# Patient Record
Sex: Male | Born: 1935 | Race: White | Hispanic: No | Marital: Married | Smoking: Never smoker
Health system: Southern US, Community
[De-identification: ages and names within clinical notes are randomized; demographics above are authoritative.]

---

## 2021-12-03 ENCOUNTER — Observation Stay (HOSPITAL_BASED_OUTPATIENT_CLINIC_OR_DEPARTMENT_OTHER)
Admission: EM | Admit: 2021-12-03 | Discharge: 2021-12-04 | Disposition: A | Discharge status: Home / Self Care | Payer: Medicare Other | Attending: Internal Medicine | Admitting: Internal Medicine

## 2021-12-03 ENCOUNTER — Observation Stay (HOSPITAL_COMMUNITY): Payer: Medicare Other

## 2021-12-03 ENCOUNTER — Emergency Department (HOSPITAL_BASED_OUTPATIENT_CLINIC_OR_DEPARTMENT_OTHER): Payer: Medicare Other

## 2021-12-03 ENCOUNTER — Other Ambulatory Visit: Payer: Self-pay

## 2021-12-03 ENCOUNTER — Encounter (HOSPITAL_BASED_OUTPATIENT_CLINIC_OR_DEPARTMENT_OTHER): Payer: Self-pay | Admitting: *Deleted

## 2021-12-03 DIAGNOSIS — Z79899 Other long term (current) drug therapy: Secondary | ICD-10-CM | POA: Insufficient documentation

## 2021-12-03 DIAGNOSIS — I10 Essential (primary) hypertension: Secondary | ICD-10-CM | POA: Diagnosis not present

## 2021-12-03 DIAGNOSIS — G459 Transient cerebral ischemic attack, unspecified: Principal | ICD-10-CM | POA: Diagnosis present

## 2021-12-03 DIAGNOSIS — M6281 Muscle weakness (generalized): Secondary | ICD-10-CM | POA: Insufficient documentation

## 2021-12-03 DIAGNOSIS — Z7982 Long term (current) use of aspirin: Secondary | ICD-10-CM | POA: Diagnosis not present

## 2021-12-03 DIAGNOSIS — Z20822 Contact with and (suspected) exposure to covid-19: Secondary | ICD-10-CM | POA: Diagnosis not present

## 2021-12-03 DIAGNOSIS — H5460 Unqualified visual loss, one eye, unspecified: Secondary | ICD-10-CM

## 2021-12-03 DIAGNOSIS — H547 Unspecified visual loss: Secondary | ICD-10-CM | POA: Diagnosis present

## 2021-12-03 LAB — CBC WITH DIFFERENTIAL/PLATELET
Abs Immature Granulocytes: 0.01 10*3/uL (ref 0.00–0.07)
Basophils Absolute: 0.1 10*3/uL (ref 0.0–0.1)
Basophils Relative: 1 %
Eosinophils Absolute: 0.2 10*3/uL (ref 0.0–0.5)
Eosinophils Relative: 3 %
HCT: 37.7 % — ABNORMAL LOW (ref 39.0–52.0)
Hemoglobin: 12.3 g/dL — ABNORMAL LOW (ref 13.0–17.0)
Immature Granulocytes: 0 %
Lymphocytes Relative: 42 %
Lymphs Abs: 2.7 10*3/uL (ref 0.7–4.0)
MCH: 29.4 pg (ref 26.0–34.0)
MCHC: 32.6 g/dL (ref 30.0–36.0)
MCV: 90 fL (ref 80.0–100.0)
Monocytes Absolute: 0.7 10*3/uL (ref 0.1–1.0)
Monocytes Relative: 12 %
Neutro Abs: 2.7 10*3/uL (ref 1.7–7.7)
Neutrophils Relative %: 42 %
Platelets: 252 10*3/uL (ref 150–400)
RBC: 4.19 MIL/uL — ABNORMAL LOW (ref 4.22–5.81)
RDW: 13.7 % (ref 11.5–15.5)
WBC: 6.3 10*3/uL (ref 4.0–10.5)
nRBC: 0 % (ref 0.0–0.2)

## 2021-12-03 LAB — RESP PANEL BY RT-PCR (FLU A&B, COVID) ARPGX2
Influenza A by PCR: NEGATIVE
Influenza B by PCR: NEGATIVE
SARS Coronavirus 2 by RT PCR: NEGATIVE

## 2021-12-03 LAB — BASIC METABOLIC PANEL
Anion gap: 8 (ref 5–15)
BUN: 22 mg/dL (ref 8–23)
CO2: 24 mmol/L (ref 22–32)
Calcium: 8.8 mg/dL — ABNORMAL LOW (ref 8.9–10.3)
Chloride: 104 mmol/L (ref 98–111)
Creatinine, Ser: 1.3 mg/dL — ABNORMAL HIGH (ref 0.61–1.24)
GFR, Estimated: 53 mL/min — ABNORMAL LOW (ref >60)
Glucose, Bld: 94 mg/dL (ref 70–99)
Potassium: 4.1 mmol/L (ref 3.5–5.1)
Sodium: 136 mmol/L (ref 135–145)

## 2021-12-03 LAB — SEDIMENTATION RATE: Sed Rate: 12 mm/hr (ref 0–16)

## 2021-12-03 LAB — PROTIME-INR
INR: 1 (ref 0.8–1.2)
Prothrombin Time: 13.3 seconds (ref 11.4–15.2)

## 2021-12-03 IMAGING — MR MR MRA NECK W/O CM
1 of 3 series · 18 of 48 positions shown · non-contrast
Comparison: None.

CLINICAL DATA: Stroke follow-up



[Series 4: sag inhance (id) · sagittal · 1.2mm · 0.47mm/px · 18 of 360 slices shown]
[im 1/360]
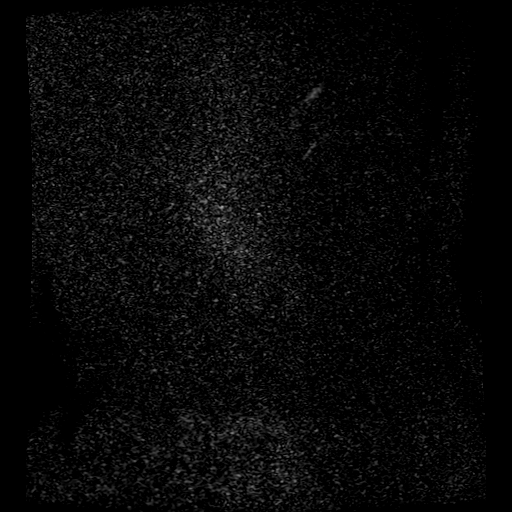
[im 12/360]
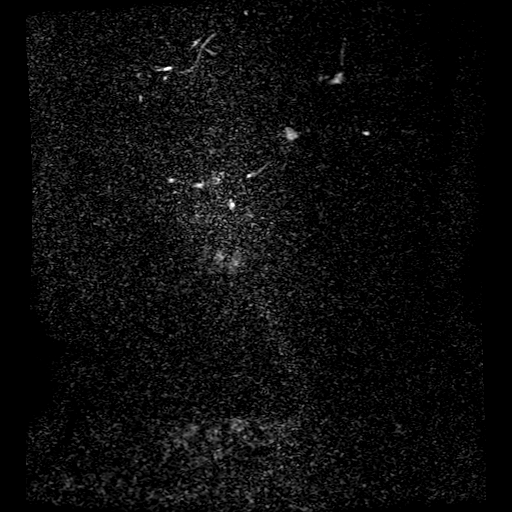
[im 23/360]
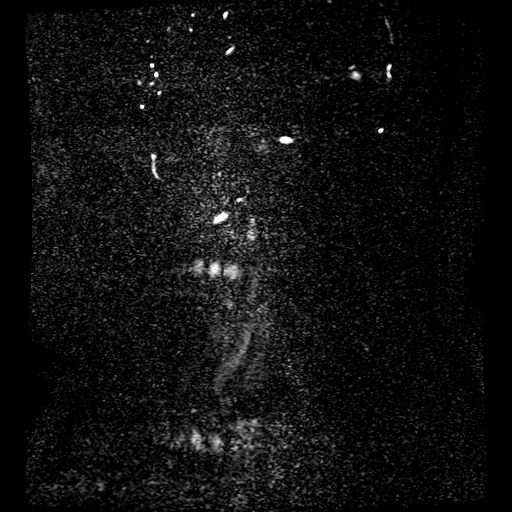
[im 34/360]
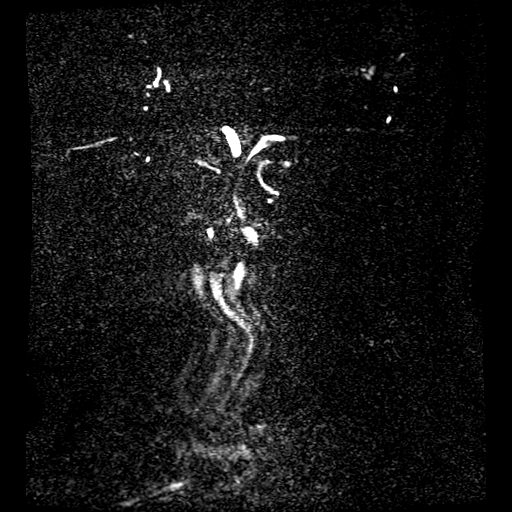
[im 45/360]
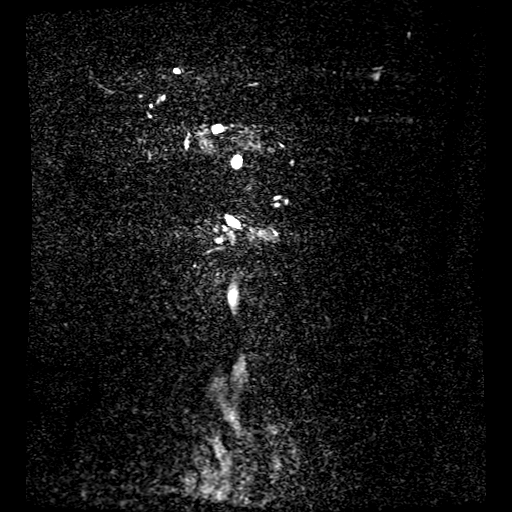
[im 57/360]
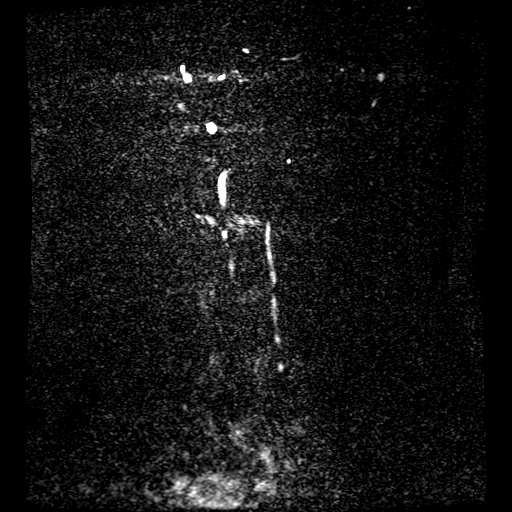
[im 68/360]
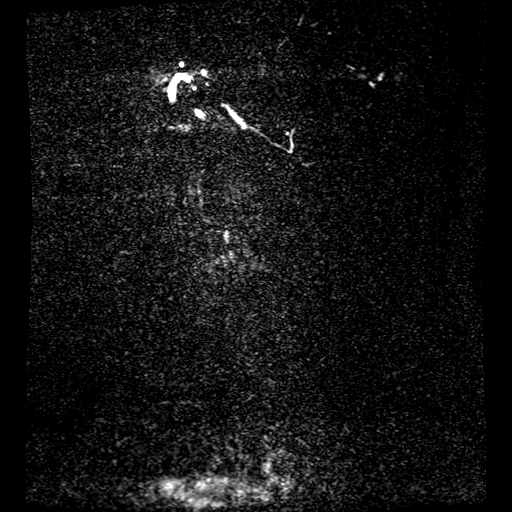
[im 79/360]
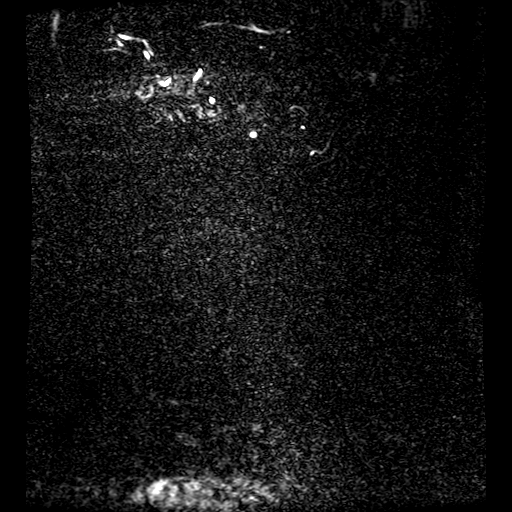
[im 90/360]
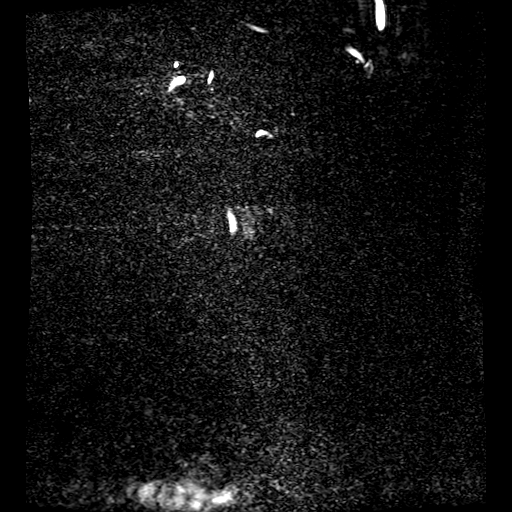
[im 101/360]
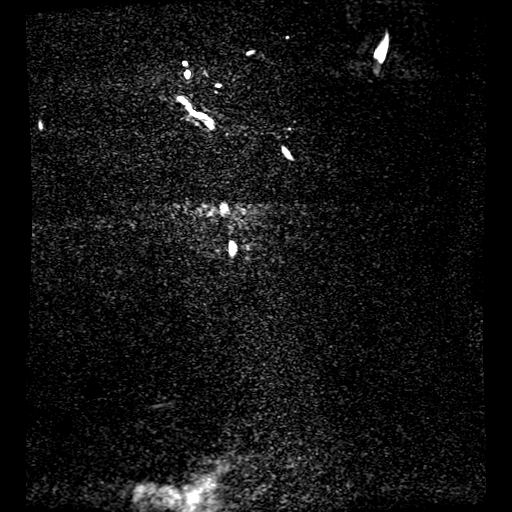
[im 113/360]
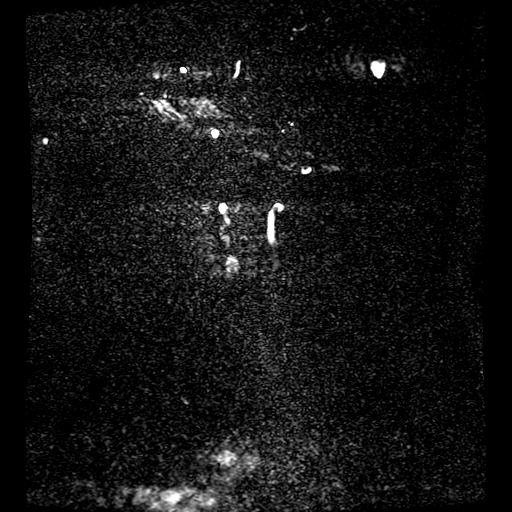
[im 158/360]
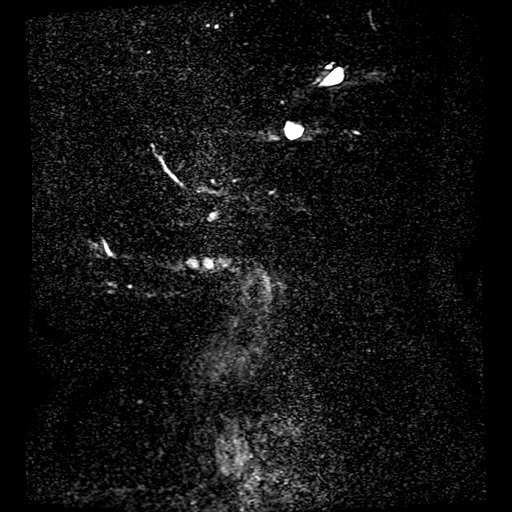
[im 180/360]
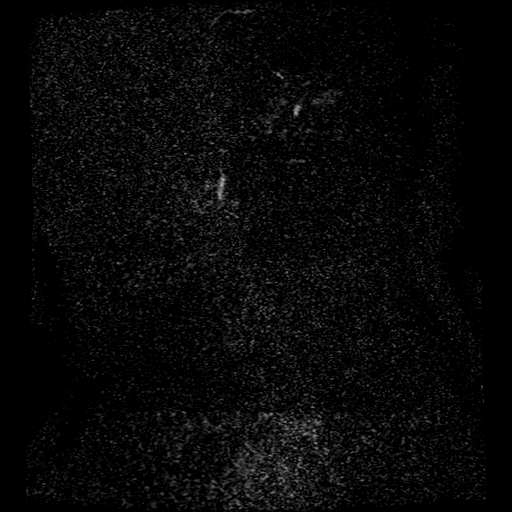
[im 202/360]
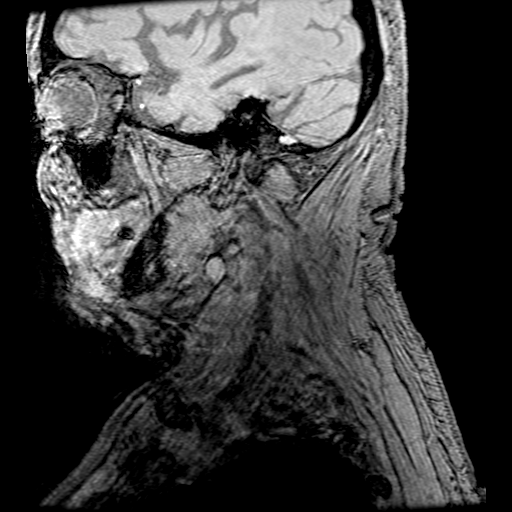
[im 247/360]
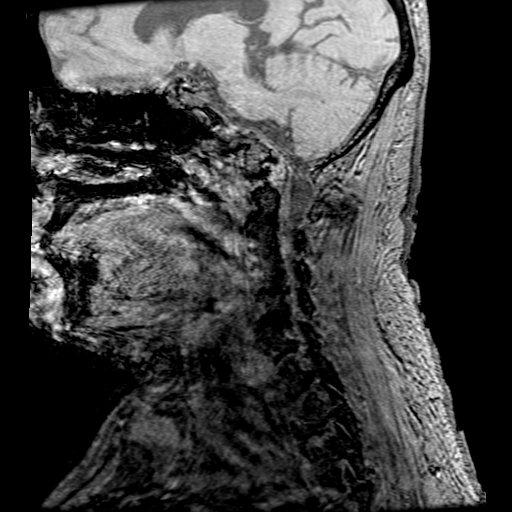
[im 292/360]
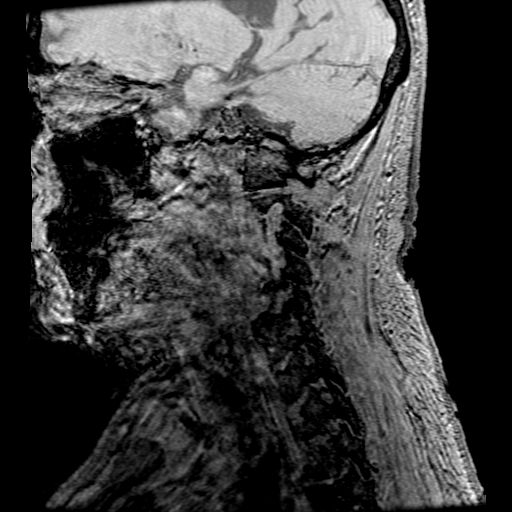
[im 303/360]
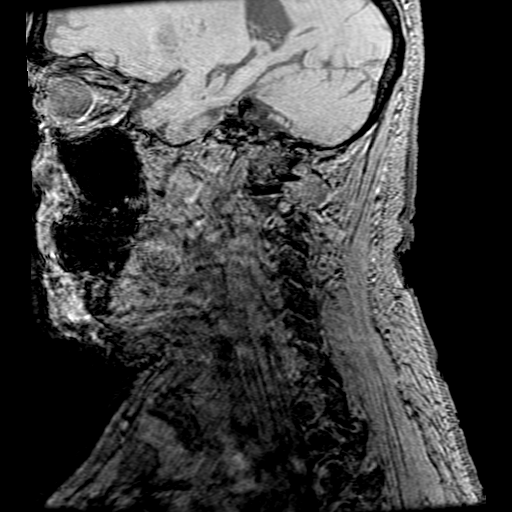
[im 337/360]
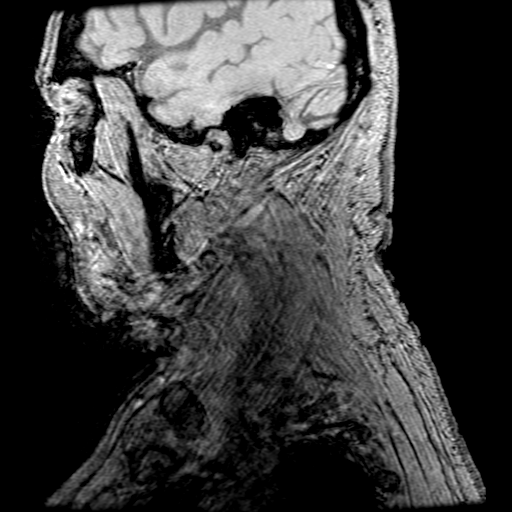

[18 of 48 positions shown; findings below may reference images not displayed]

FINDINGS: MRI HEAD FINDINGS

Brain: No acute infarct, mass effect or extra-axial collection. No
acute or chronic hemorrhage. There is multifocal hyperintense
T2-weighted signal within the white matter. Generalized volume loss
without a clear lobar predilection. If the midline structures are
normal.

Vascular: Major flow voids are preserved.

Skull and upper cervical spine: Normal calvarium and skull base.
Visualized upper cervical spine and soft tissues are normal.

Sinuses/Orbits:No paranasal sinus fluid levels or advanced mucosal
thickening. No mastoid or middle ear effusion. Normal orbits.

MRA HEAD FINDINGS

POSTERIOR CIRCULATION:

--Vertebral arteries: Normal

--Inferior cerebellar arteries: Normal.

--Basilar artery: Normal.

--Superior cerebellar arteries: Normal.

--Posterior cerebral arteries: Normal.

ANTERIOR CIRCULATION:

--Intracranial internal carotid arteries: There is a medially
projecting aneurysm measuring 2 x 2 mm arising from the distal
cavernous segment of the right internal artery. Additionally, there
is a superiorly and laterally projecting aneurysm in the same
location measuring 3 x 2 mm. The left internal carotid artery is
normal.

--Anterior cerebral arteries (ACA): Normal.

--Middle cerebral arteries (MCA): Normal.

ANATOMIC VARIANTS: Fetal origin of the left PCA.

MRA NECK FINDINGS

Three vessel branch pattern of the aorta. Right dominant vertebral
arteries. The left vertebral artery V1 segment shows no flow related
enhancement. The right vertebral artery is normal. The carotid
systems are normal.
IMPRESSION: 1. No acute intracranial abnormality.
2. Mild chronic small vessel disease and volume loss.
3. No emergent large vessel occlusion or high-grade stenosis.
4. There are 2 aneurysms of the distal cavernous segment of the
right internal carotid artery, each measuring approximately 2 x 3
mm.

## 2021-12-03 IMAGING — CT CT HEAD W/O CM
4 series · 16 of 47 positions shown, 18 images · non-contrast
Comparison: None.

CLINICAL DATA: loss of vision in Left eye (temp)

EXAM:
CT HEAD WITHOUT CONTRAST
TECHNIQUE: Contiguous axial images were obtained from the base of the skull
through the vertex without intravenous contrast.

[Series 2: head wo · axial · 0.44mm/px · z∈[-99,+11]mm · 7 of 30 slices shown, 9 images]
[im 4/30  brain]
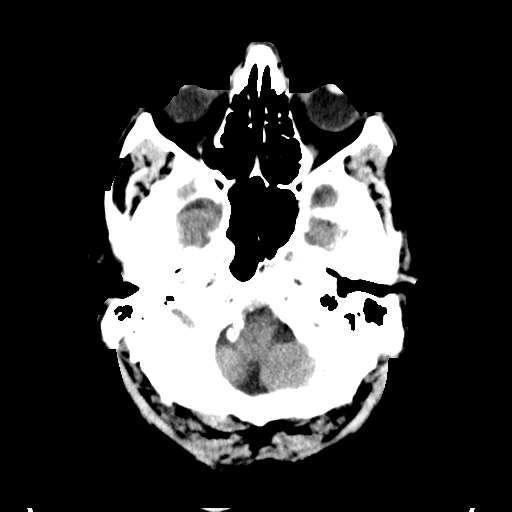
[im 4/30  bone]
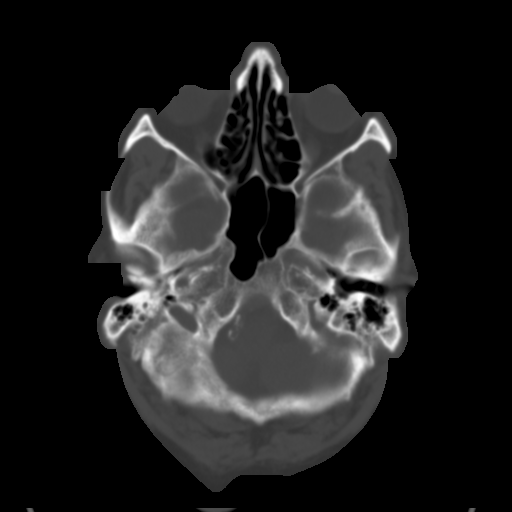
[im 8/30  brain]
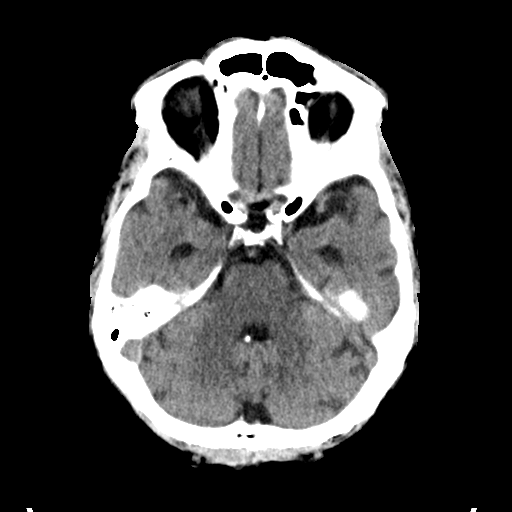
[im 11/30  brain]
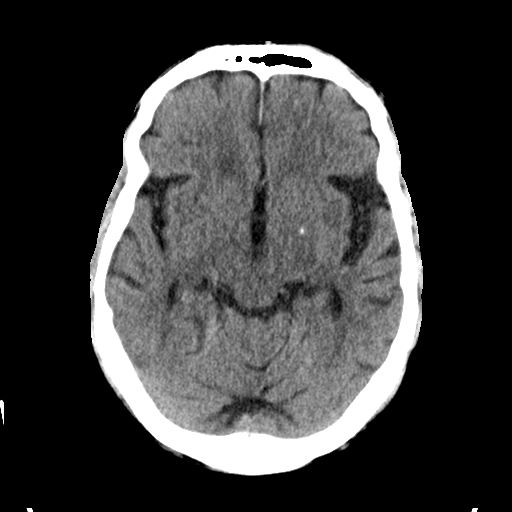
[im 15/30  brain]
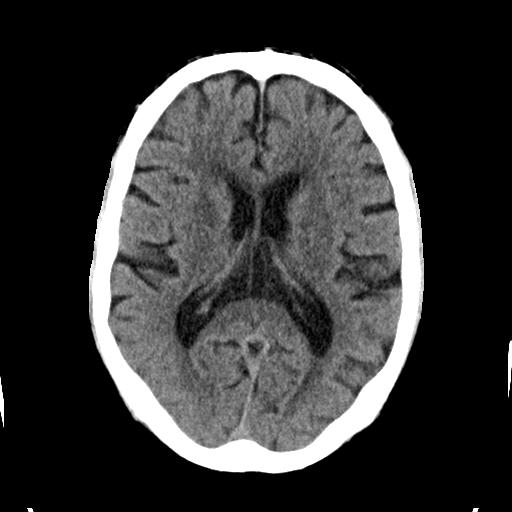
[im 19/30  brain]
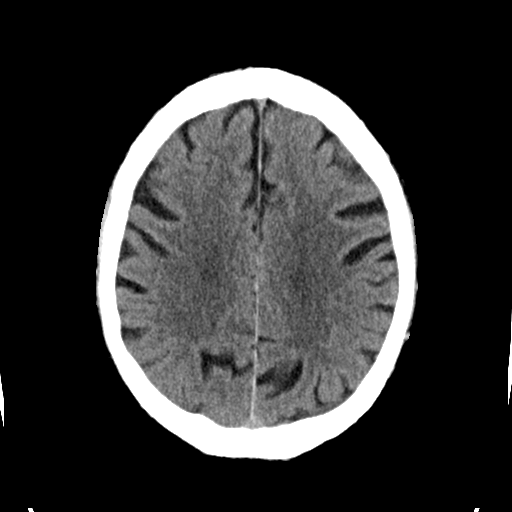
[im 19/30  bone]
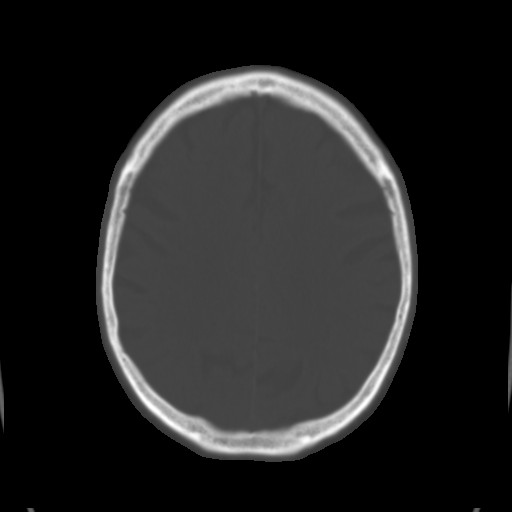
[im 22/30  brain]
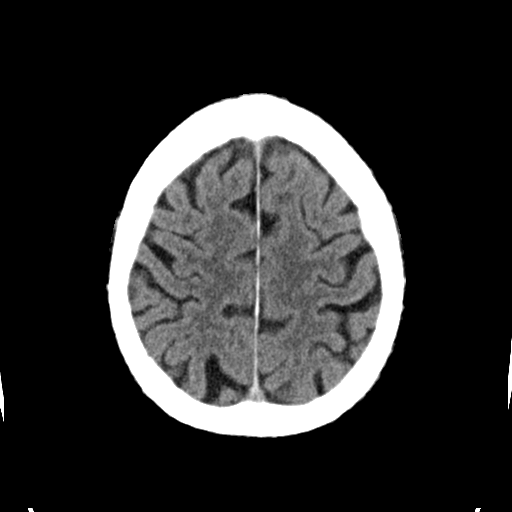
[im 26/30  brain]
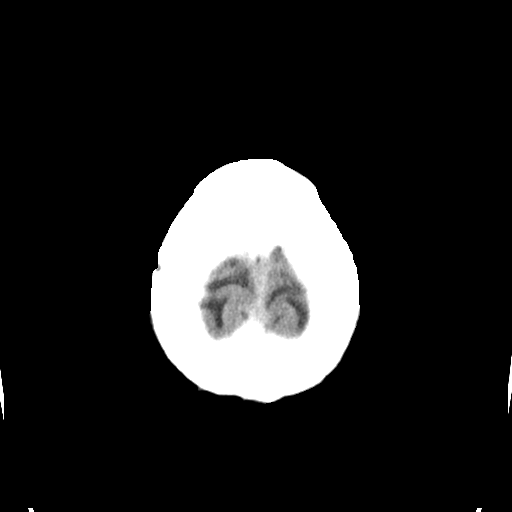

[Series 3: head bone · axial · 0.44mm/px · z∈[-100,-72]mm · 3 of 74 slices shown]
[im 8/74  bone]
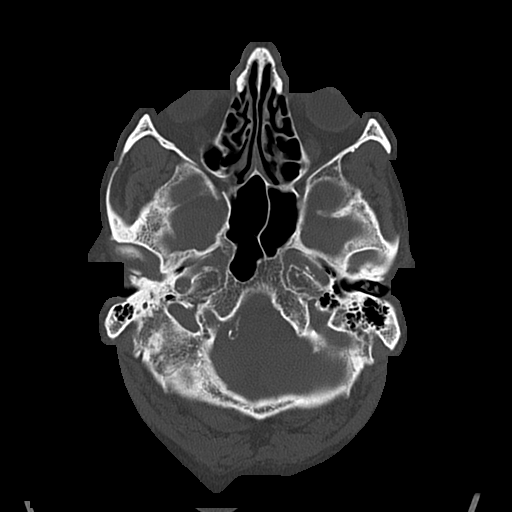
[im 15/74  bone]
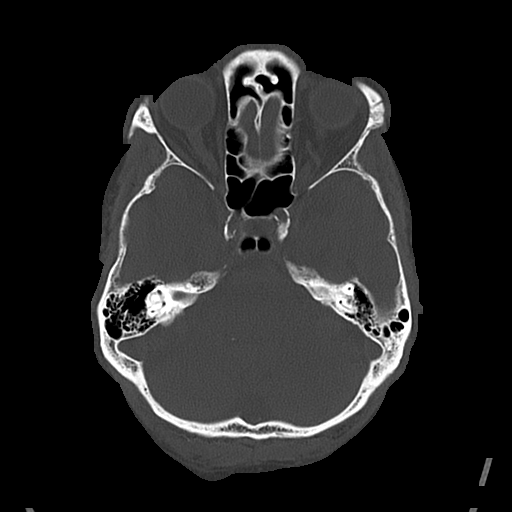
[im 22/74  bone]
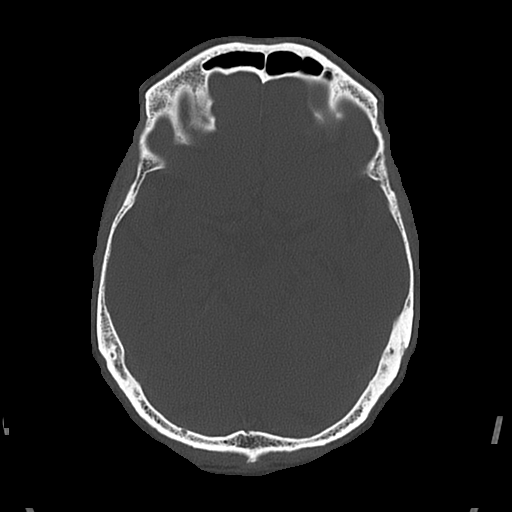

[Series 4: coronal soft · coronal · 0.29mm/px · 3 of 69 slices shown]
[im 23/69  brain]
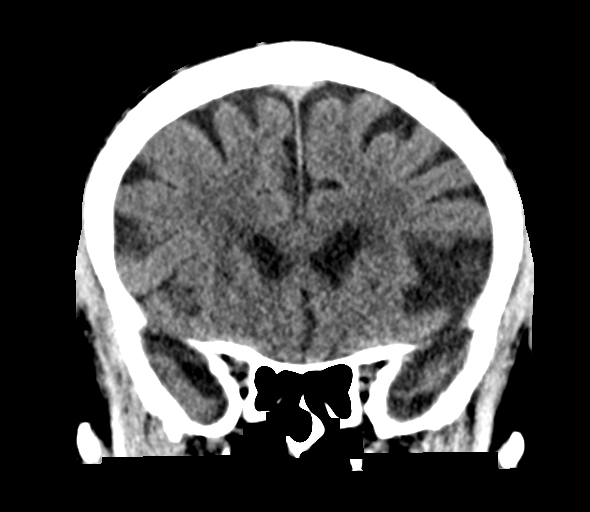
[im 31/69  brain]
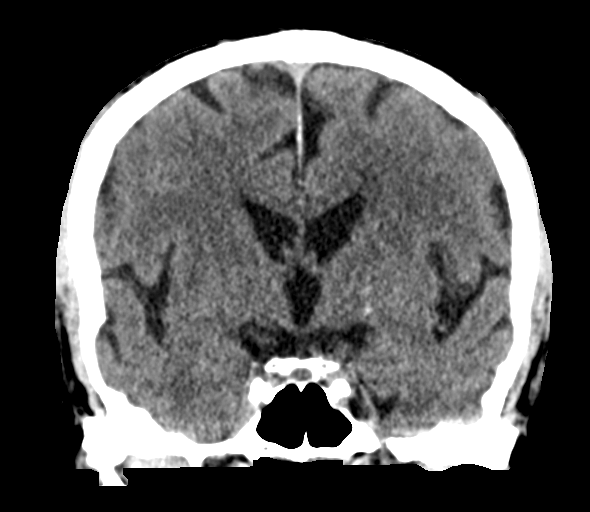
[im 38/69  brain]
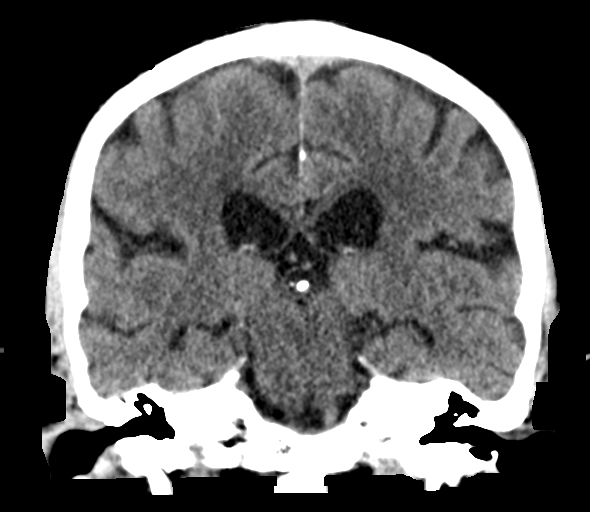

[Series 5: sagittal soft · sagittal · 0.30mm/px · 3 of 59 slices shown]
[im 20/59  brain]
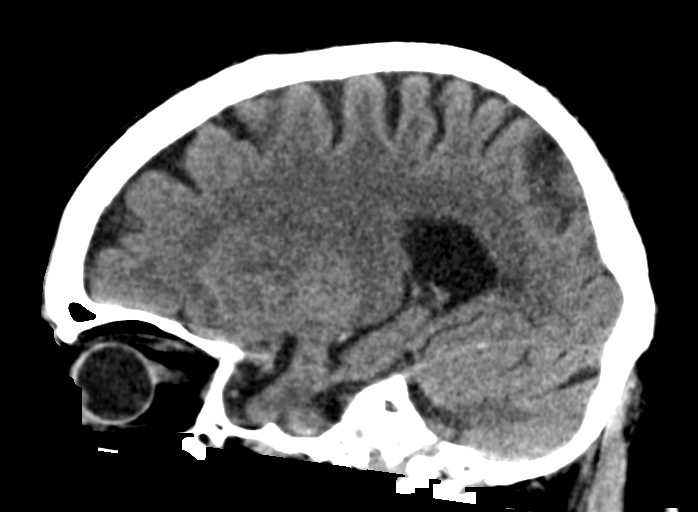
[im 30/59  brain]
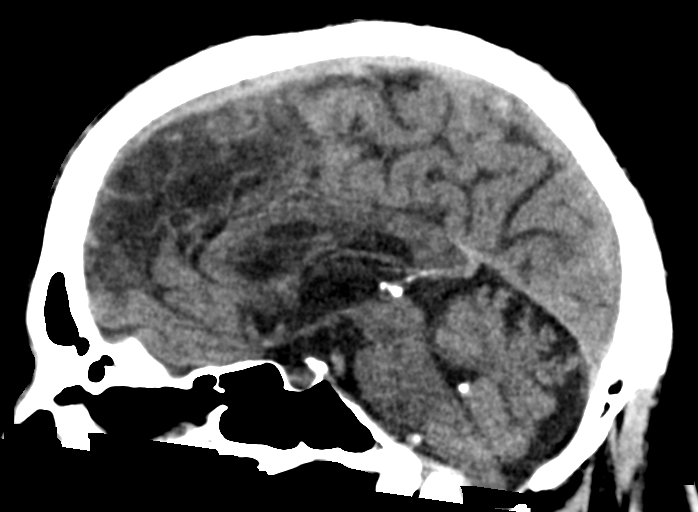
[im 39/59  brain]
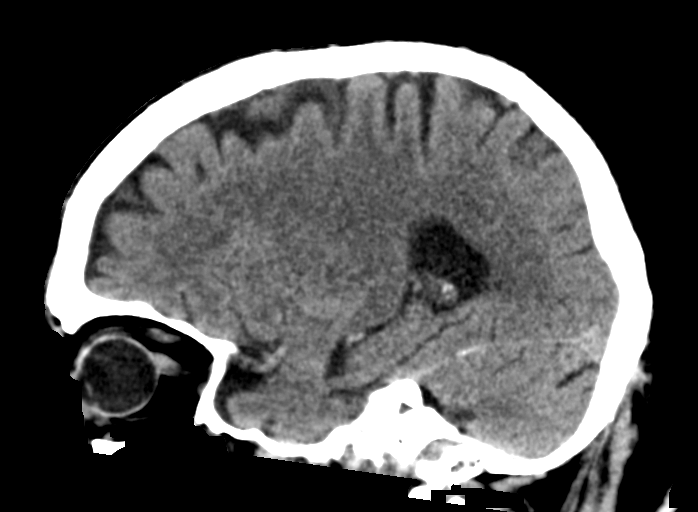

[16 of 47 positions shown; findings below may reference images not displayed]

FINDINGS: Brain: Patchy hypodensities in the basal ganglia bilaterally. No
evidence of acute hemorrhage, mass lesion, hydrocephalus,
extra-axial fluid collection or midline shift. Mild for age atrophy.

Vascular: No hyperdense vessel identified. Calcific intracranial
atherosclerosis.

Skull: No acute fracture.

Sinuses/Orbits: Visualized sinuses are clear.

Other: No mastoid effusions.
IMPRESSION: Patchy hypodensities within the basal ganglia bilaterally. This
could potentially represent chronic microvascular ischemic disease;
however, in the absence of a prior acute infarct is difficult to
exclude. Consider MRI for more sensitive evaluation.

## 2021-12-03 IMAGING — MR MR HEAD W/O CM
6 of 11 series · 24 of 48 positions shown · non-contrast
Comparison: None.

CLINICAL DATA: Stroke follow-up



[Series 2: DWI · axial · 3.0mm · 0.94mm/px · z∈[-140,+13]mm · 7 of 106 slices shown (1 of 2)]
[im 1/106]
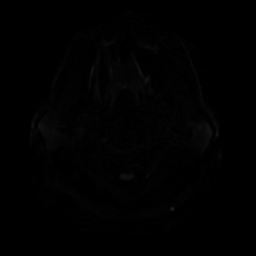
[im 18/106]
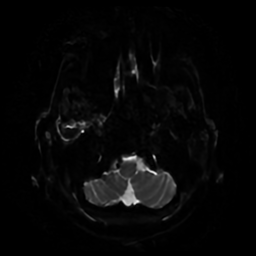
[im 36/106]
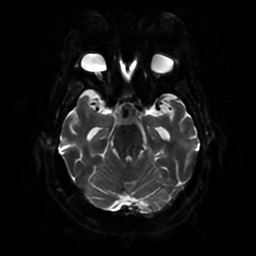
[im 53/106]
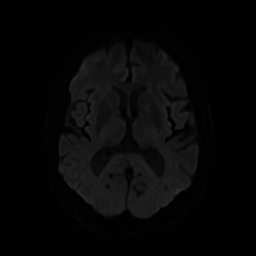
[im 71/106]
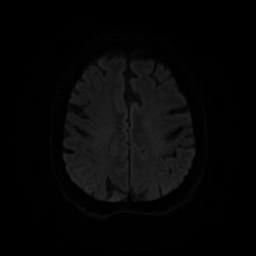
[im 88/106]
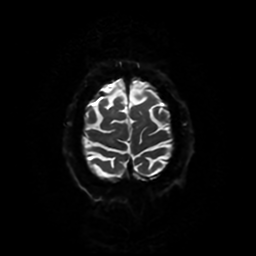
[im 106/106]
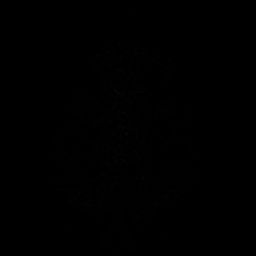

[Series 3: DWI · coronal · 4.0mm · 0.94mm/px · 5 of 76 slices shown (2 of 2)]
[im 1/76]
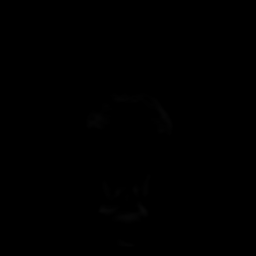
[im 19/76]
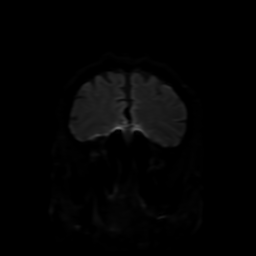
[im 38/76]
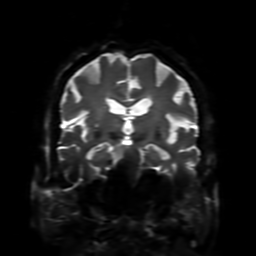
[im 57/76]
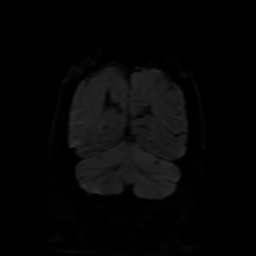
[im 76/76]
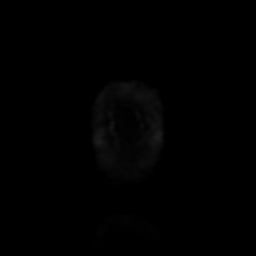

[Series 4: FLAIR · sagittal · 5.0mm · 0.23mm/px · 2 of 27 slices shown (1 of 2)]
[im 1/27]
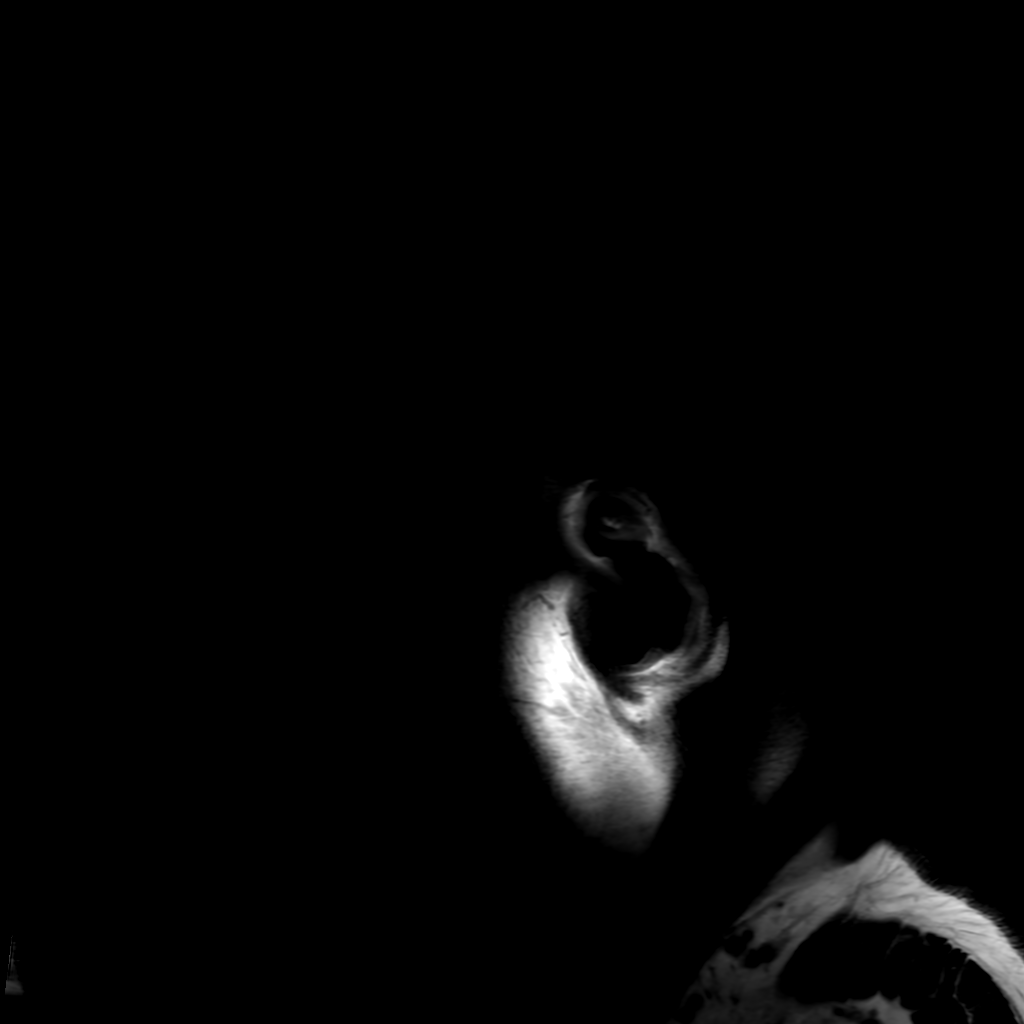
[im 27/27]
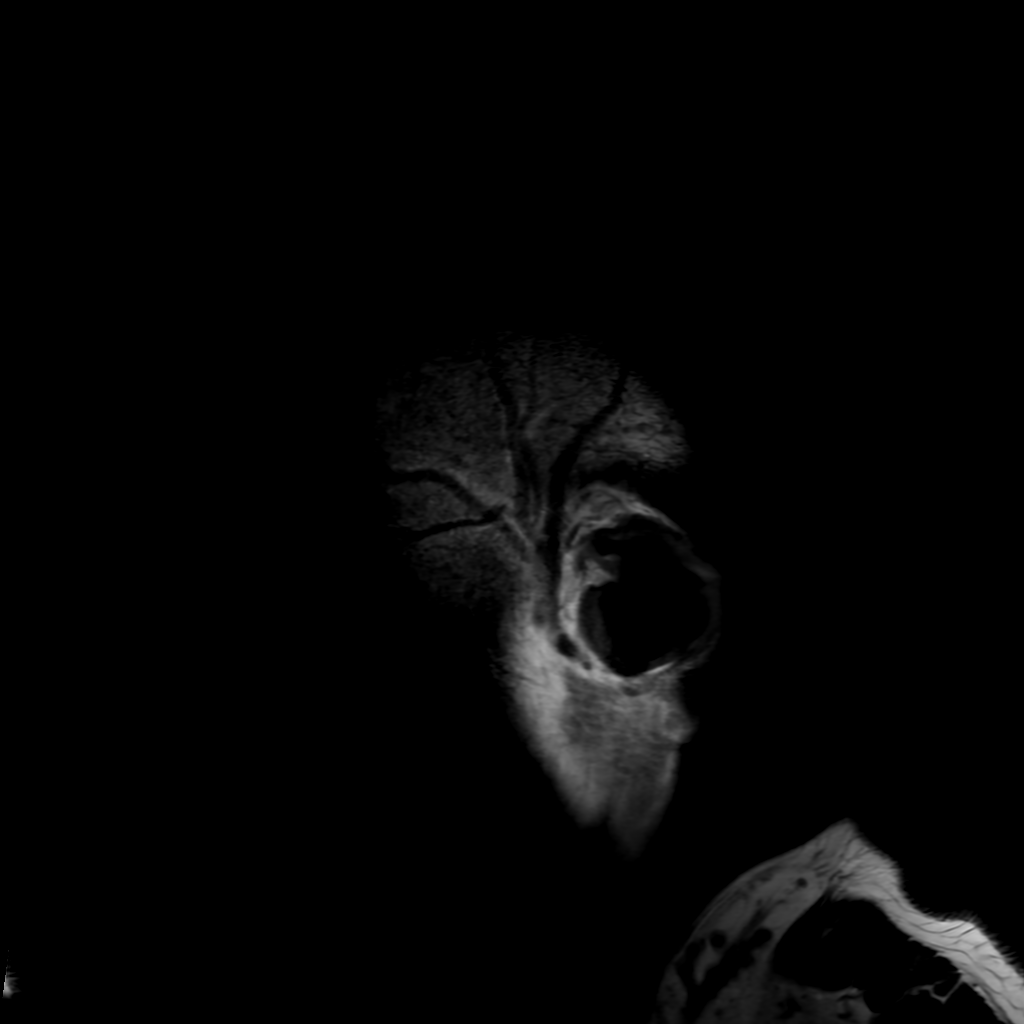

[Series 6: FLAIR · axial · 4.0mm · 0.45mm/px · z∈[-142,+3]mm · 3 of 36 slices shown (2 of 2)]
[im 1/36]
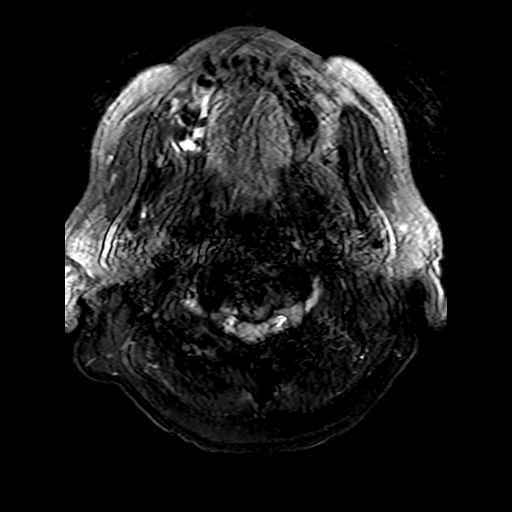
[im 18/36]
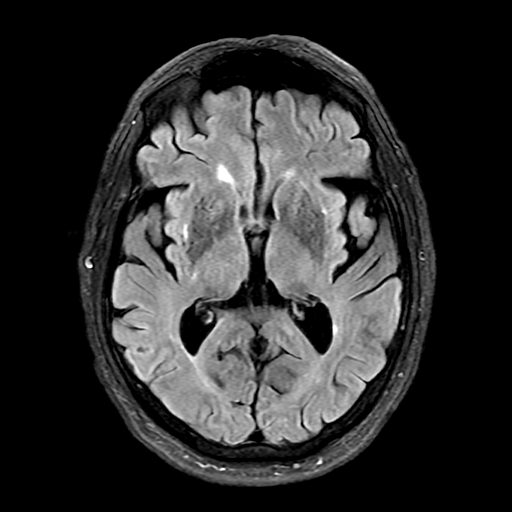
[im 36/36]
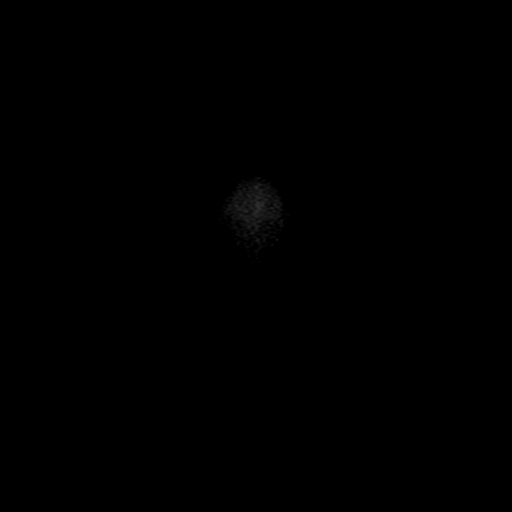

[Series 250: ADC · axial · 3.0mm · 0.94mm/px · z∈[-140,+13]mm · 4 of 54 slices shown (1 of 2)]
[im 1/54]
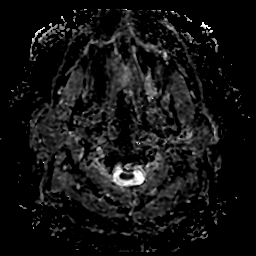
[im 18/54]
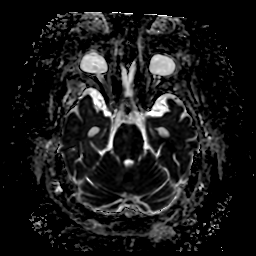
[im 36/54]
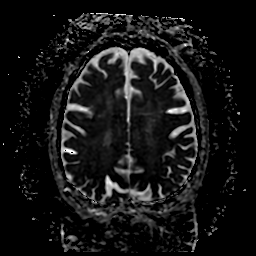
[im 54/54]
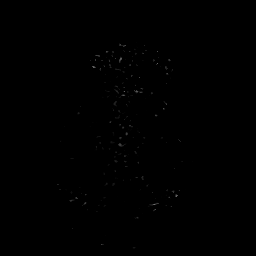

[Series 350: ADC · coronal · 4.0mm · 0.94mm/px · 3 of 37 slices shown (2 of 2)]
[im 1/37]
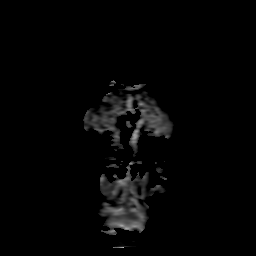
[im 19/37]
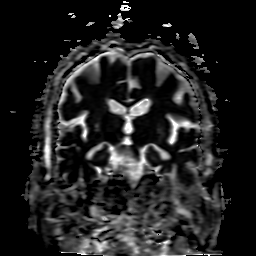
[im 37/37]
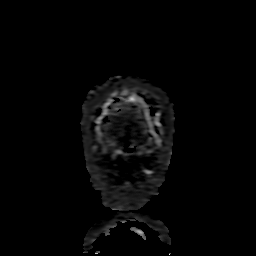

[24 of 48 positions shown; findings below may reference images not displayed]

FINDINGS: MRI HEAD FINDINGS

Brain: No acute infarct, mass effect or extra-axial collection. No
acute or chronic hemorrhage. There is multifocal hyperintense
T2-weighted signal within the white matter. Generalized volume loss
without a clear lobar predilection. If the midline structures are
normal.

Vascular: Major flow voids are preserved.

Skull and upper cervical spine: Normal calvarium and skull base.
Visualized upper cervical spine and soft tissues are normal.

Sinuses/Orbits:No paranasal sinus fluid levels or advanced mucosal
thickening. No mastoid or middle ear effusion. Normal orbits.

MRA HEAD FINDINGS

POSTERIOR CIRCULATION:

--Vertebral arteries: Normal

--Inferior cerebellar arteries: Normal.

--Basilar artery: Normal.

--Superior cerebellar arteries: Normal.

--Posterior cerebral arteries: Normal.

ANTERIOR CIRCULATION:

--Intracranial internal carotid arteries: There is a medially
projecting aneurysm measuring 2 x 2 mm arising from the distal
cavernous segment of the right internal artery. Additionally, there
is a superiorly and laterally projecting aneurysm in the same
location measuring 3 x 2 mm. The left internal carotid artery is
normal.

--Anterior cerebral arteries (ACA): Normal.

--Middle cerebral arteries (MCA): Normal.

ANATOMIC VARIANTS: Fetal origin of the left PCA.

MRA NECK FINDINGS

Three vessel branch pattern of the aorta. Right dominant vertebral
arteries. The left vertebral artery V1 segment shows no flow related
enhancement. The right vertebral artery is normal. The carotid
systems are normal.
IMPRESSION: 1. No acute intracranial abnormality.
2. Mild chronic small vessel disease and volume loss.
3. No emergent large vessel occlusion or high-grade stenosis.
4. There are 2 aneurysms of the distal cavernous segment of the
right internal carotid artery, each measuring approximately 2 x 3
mm.

## 2021-12-03 IMAGING — MR MR MRA HEAD W/O CM
2 series · 18 of 48 positions shown · non-contrast
Comparison: None.

CLINICAL DATA: Stroke follow-up



[Series 2: ax (id) · axial · 1.0mm · 0.43mm/px · z∈[-116,-35]mm · 17 of 176 slices shown]
[im 1/176]
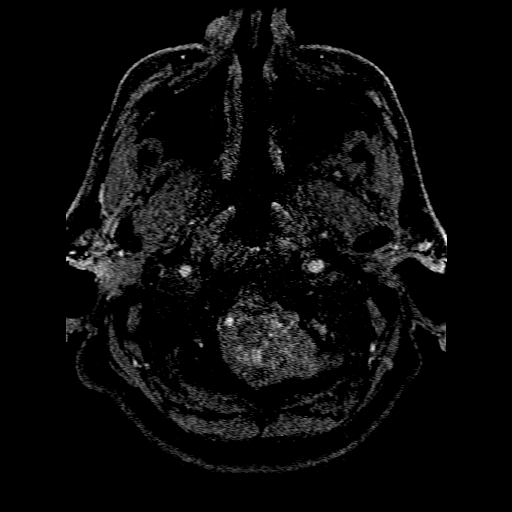
[im 4/176]
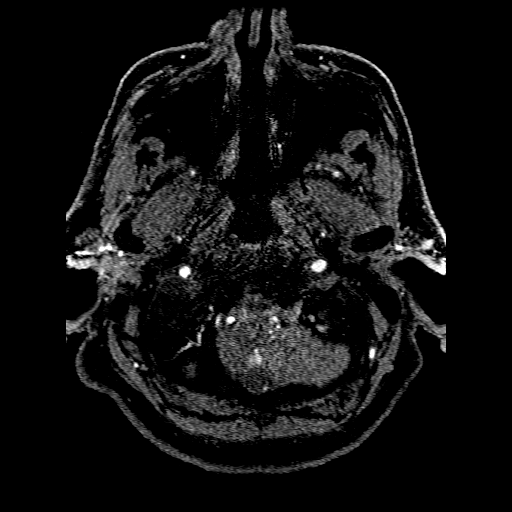
[im 8/176]
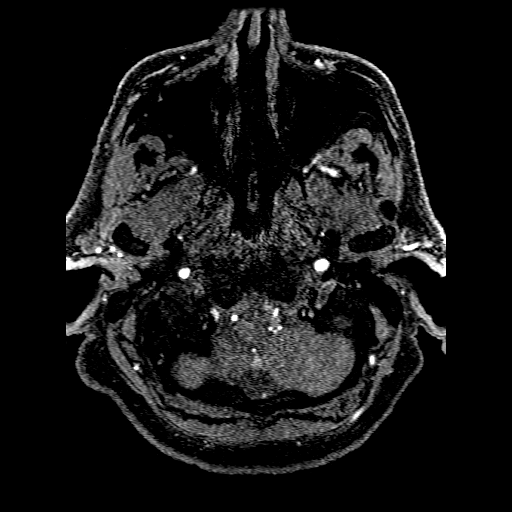
[im 12/176]
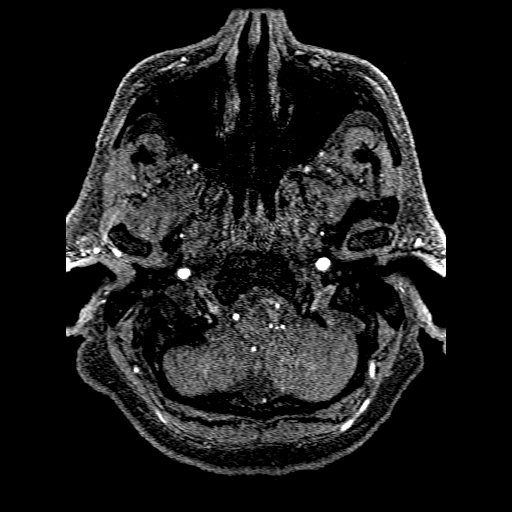
[im 16/176]
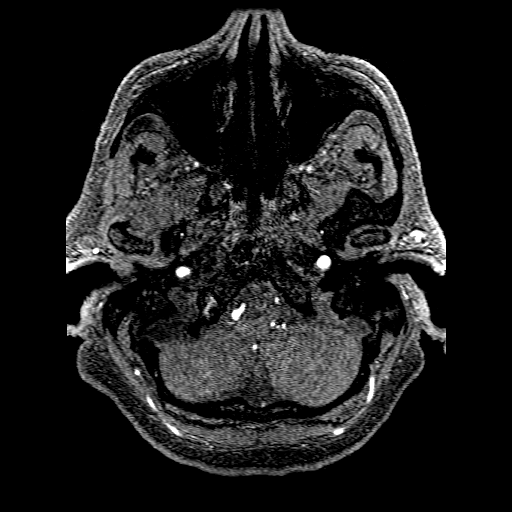
[im 20/176]
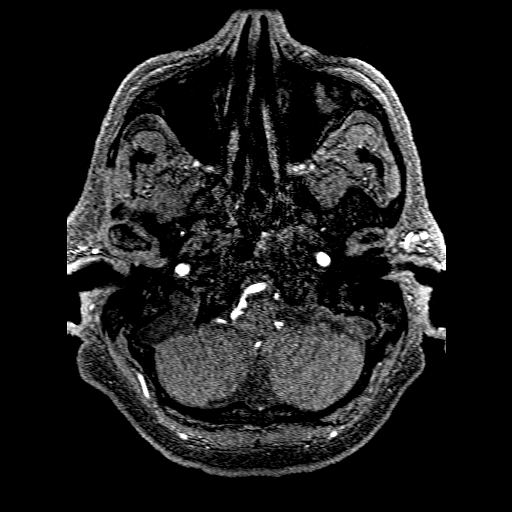
[im 23/176]
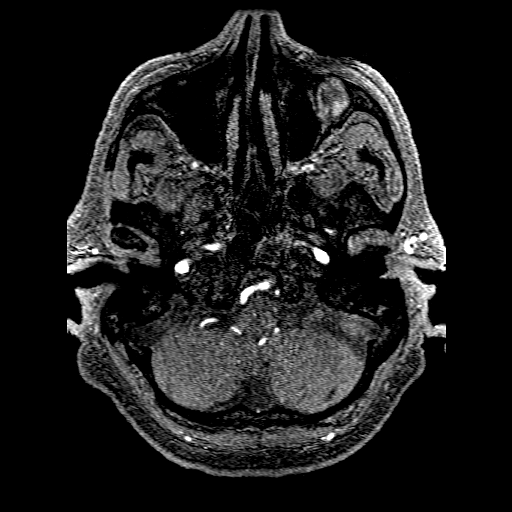
[im 27/176]
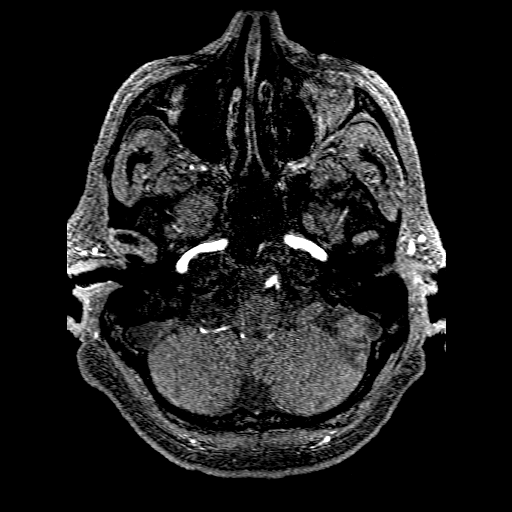
[im 31/176]
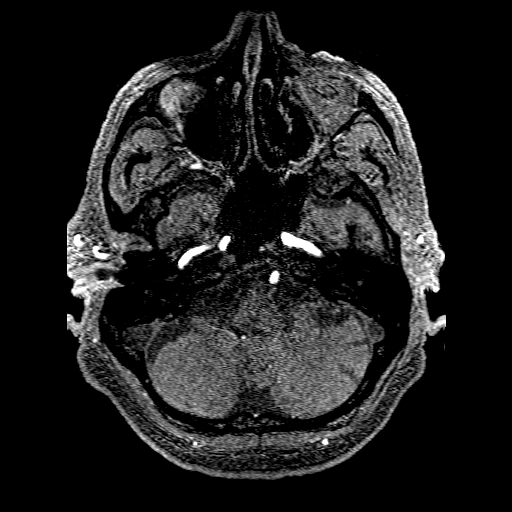
[im 54/176]
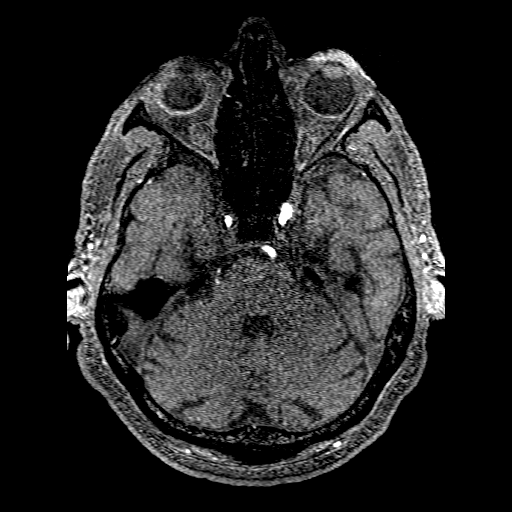
[im 77/176]
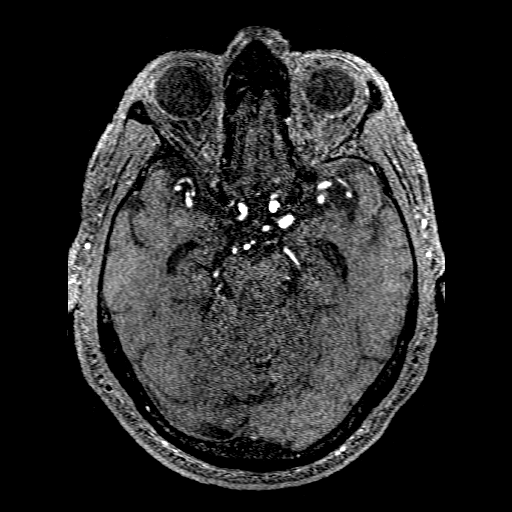
[im 88/176]
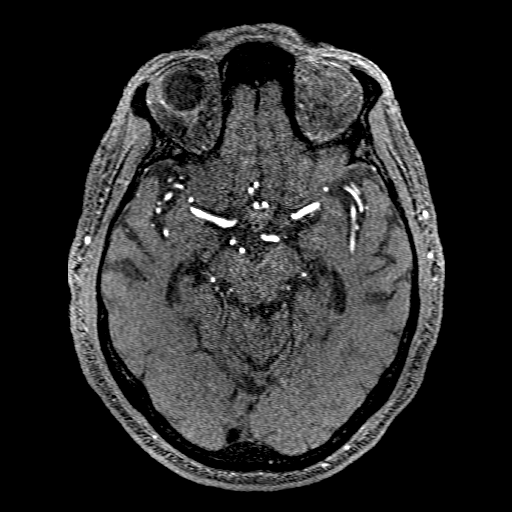
[im 99/176]
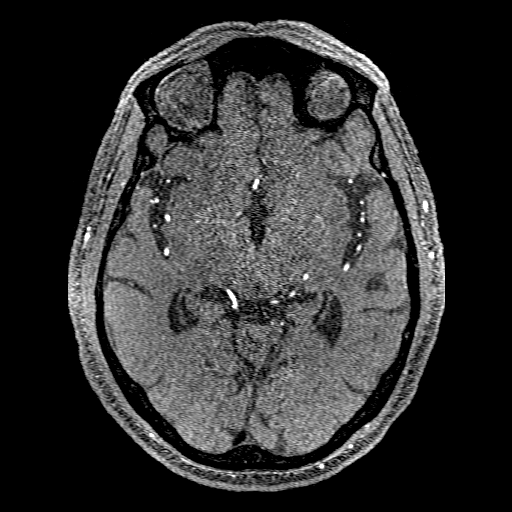
[im 122/176]
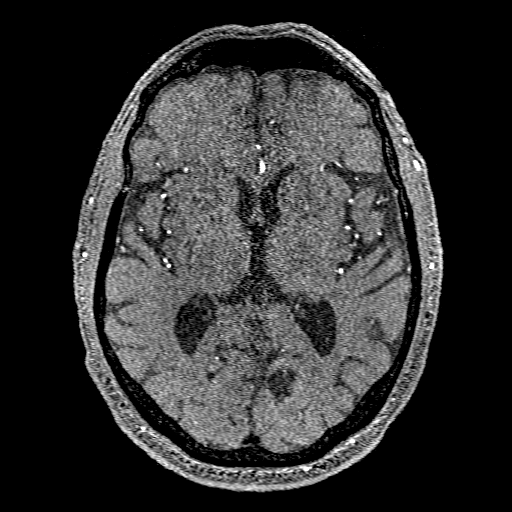
[im 145/176]
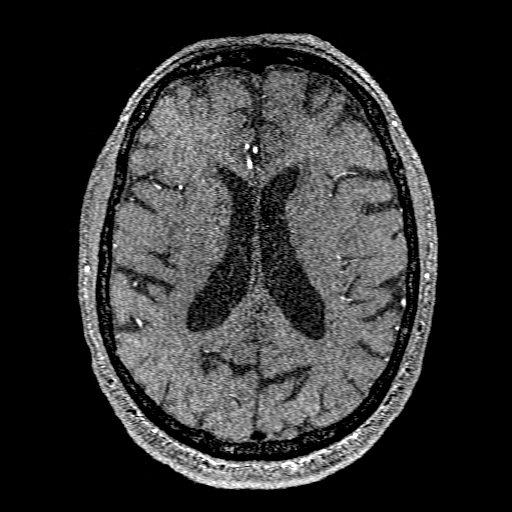
[im 149/176]
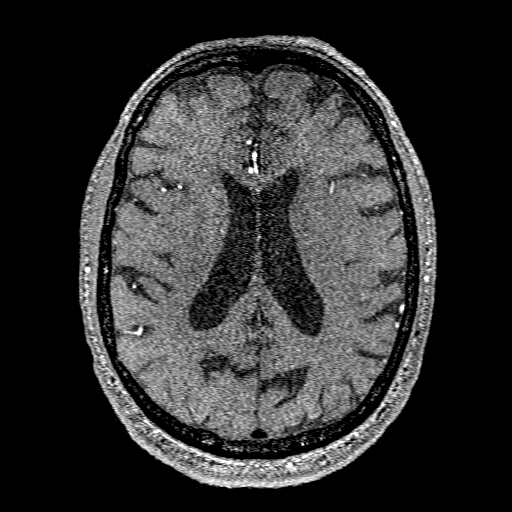
[im 168/176]
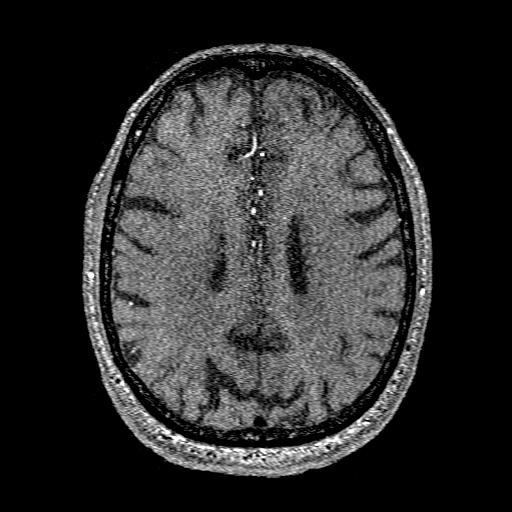

[Series 201: pjn:ax (id) · sagittal · 1.0mm · 0.43mm/px · 1 of 4 slices shown]
[im 1/4]
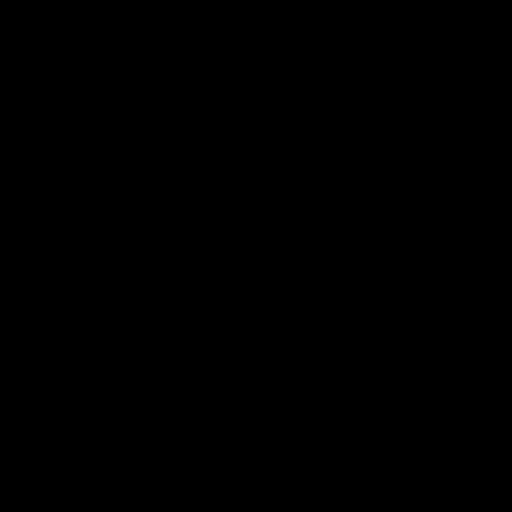

[18 of 48 positions shown; findings below may reference images not displayed]

FINDINGS: MRI HEAD FINDINGS

Brain: No acute infarct, mass effect or extra-axial collection. No
acute or chronic hemorrhage. There is multifocal hyperintense
T2-weighted signal within the white matter. Generalized volume loss
without a clear lobar predilection. If the midline structures are
normal.

Vascular: Major flow voids are preserved.

Skull and upper cervical spine: Normal calvarium and skull base.
Visualized upper cervical spine and soft tissues are normal.

Sinuses/Orbits:No paranasal sinus fluid levels or advanced mucosal
thickening. No mastoid or middle ear effusion. Normal orbits.

MRA HEAD FINDINGS

POSTERIOR CIRCULATION:

--Vertebral arteries: Normal

--Inferior cerebellar arteries: Normal.

--Basilar artery: Normal.

--Superior cerebellar arteries: Normal.

--Posterior cerebral arteries: Normal.

ANTERIOR CIRCULATION:

--Intracranial internal carotid arteries: There is a medially
projecting aneurysm measuring 2 x 2 mm arising from the distal
cavernous segment of the right internal artery. Additionally, there
is a superiorly and laterally projecting aneurysm in the same
location measuring 3 x 2 mm. The left internal carotid artery is
normal.

--Anterior cerebral arteries (ACA): Normal.

--Middle cerebral arteries (MCA): Normal.

ANATOMIC VARIANTS: Fetal origin of the left PCA.

MRA NECK FINDINGS

Three vessel branch pattern of the aorta. Right dominant vertebral
arteries. The left vertebral artery V1 segment shows no flow related
enhancement. The right vertebral artery is normal. The carotid
systems are normal.
IMPRESSION: 1. No acute intracranial abnormality.
2. Mild chronic small vessel disease and volume loss.
3. No emergent large vessel occlusion or high-grade stenosis.
4. There are 2 aneurysms of the distal cavernous segment of the
right internal carotid artery, each measuring approximately 2 x 3
mm.

## 2021-12-03 MED ORDER — STROKE: EARLY STAGES OF RECOVERY BOOK
Freq: Once | Status: AC
  Filled 2021-12-03: qty 1

## 2021-12-03 MED ORDER — ACETAMINOPHEN 650 MG RE SUPP: 650.0000 mg | RECTAL | Status: DC | PRN

## 2021-12-03 MED ORDER — HYDRALAZINE HCL 25 MG PO TABS: 25.0000 mg | ORAL_TABLET | Freq: Four times a day (QID) | ORAL | Status: DC | PRN

## 2021-12-03 MED ORDER — ENOXAPARIN SODIUM 40 MG/0.4ML IJ SOSY
40.0000 mg | PREFILLED_SYRINGE | INTRAMUSCULAR | Status: DC
  Administered 2021-12-03: 20:00:00 40 mg via SUBCUTANEOUS
  Filled 2021-12-03: qty 0.4

## 2021-12-03 MED ORDER — ASPIRIN 81 MG PO CHEW
81.0000 mg | CHEWABLE_TABLET | Freq: Every day | ORAL | Status: DC
  Administered 2021-12-04: 10:00:00 81 mg via ORAL
  Filled 2021-12-03: qty 1

## 2021-12-03 MED ORDER — SIMVASTATIN 20 MG PO TABS
20.0000 mg | ORAL_TABLET | Freq: Every day | ORAL | Status: DC
  Administered 2021-12-03 – 2021-12-04 (×2): 20 mg via ORAL
  Filled 2021-12-03 (×2): qty 1

## 2021-12-03 MED ORDER — ACETAMINOPHEN 325 MG PO TABS: 650.0000 mg | ORAL_TABLET | ORAL | Status: DC | PRN

## 2021-12-03 MED ORDER — PANTOPRAZOLE SODIUM 40 MG PO TBEC
40.0000 mg | DELAYED_RELEASE_TABLET | Freq: Every day | ORAL | Status: DC
  Administered 2021-12-04: 10:00:00 40 mg via ORAL
  Filled 2021-12-03: qty 1

## 2021-12-03 MED ORDER — SENNOSIDES-DOCUSATE SODIUM 8.6-50 MG PO TABS: 1.0000 | ORAL_TABLET | Freq: Every evening | ORAL | Status: DC | PRN

## 2021-12-03 MED ORDER — ACETAMINOPHEN 160 MG/5ML PO SOLN: 650.0000 mg | ORAL | Status: DC | PRN

## 2021-12-03 NOTE — ED Triage Notes (Signed)
Pt had an onset of left visual loss for approx 5 min about an hour ago. Vision has returned. Dr Rosalia Hammers in triage to evaluate pt.

## 2021-12-03 NOTE — ED Notes (Signed)
Attempt report x2. Sent secure chat to receiving RN

## 2021-12-03 NOTE — H&P (Signed)
History and Physical    Donald Garcia JJO:[id number] DOB: 07-May-1931 DOA: 12/03/2021  PCP: System, Provider Not In (Confirm with patient/family/NH records and if not entered, this has to be entered at [facility] point of entry) Patient coming from: Home  I have personally briefly reviewed patient's old medical records in [facility]  Chief Complaint: Blurry vision  HPI: Donald Garcia is a 87 y.o. male with medical history significant of HTN, HLD, MGUS, migraines, GERD, presented with temporary vision loss.  Patient travels from [place]. He has history of migraines, which he described as flashing point in visual field, as frequent as once a week, resolved by itself in few minutes, no accompanying headache or weakness nor numbness of any of the limbs.  This month, had two such episode.  Today, he was walking inside the house and started to have " clouds in the left visual field" soon, he lost vision on the left side completely.  He said that it happened so suddenly that he cannot remember whether the vision on the right side was affected. After 4-5 minutes his vision on the left side recovered. At baseline, his right eye has poor vision compared to the left.  He denied any numbness or weakness of any of the limbs.   MGUS was diagnosed in 2009, had a bone marrow biopsy in 2019.  Has been following with hematologist in [place], according to patient and his family, his condition has been stable, never had chemotherapy.  Most recent visit was 1 month ago and was told condition was stable.  ED Course: Vital signs stable, CT head without contrast showed hypodensity of the right basal ganglia.  Review of Systems: As per HPI otherwise 14 point review of systems negative.    Past Medical History:  Diagnosis Date   GERD (gastroesophageal reflux disease)    Hyperlipemia    Hypertension     History reviewed. No pertinent surgical history.   reports that he has never smoked. He has never used smokeless  tobacco. He reports that he does not drink alcohol and does not use drugs.  Allergies  Allergen Reactions   Lisinopril     No family history on file.  Prior to Admission medications   Medication Sig Start Date End Date Taking? Authorizing Provider  aspirin 81 MG chewable tablet Chew 81 mg by mouth daily. Chew 4 tablets morning of 10-03-21   Yes [provider]  losartan (COZAAR) 25 MG tablet Take 25 mg by mouth daily. evening   Yes [provider]  metoprolol tartrate (LOPRESSOR) 25 MG tablet Take 25 mg by mouth daily.   Yes [provider]  omeprazole (PRILOSEC) 20 MG capsule Take 20 mg by mouth daily. otc   Yes [provider]  simvastatin (ZOCOR) 20 MG tablet Take 20 mg by mouth daily.   Yes [provider]    Physical Exam: Vitals:   12/03/21 1400 12/03/21 1500 12/03/21 1645 12/03/21 1736  BP: (!) 146/80 139/76  (!) 179/76  Pulse: 71 65 68 77  Resp: 17 14 16 17   Temp:      SpO2: 100% 100% 100% 100%  Weight:      Height:        Constitutional: NAD, calm, comfortable Vitals:   12/03/21 1400 12/03/21 1500 12/03/21 1645 12/03/21 1736  BP: (!) 146/80 139/76  (!) 179/76  Pulse: 71 65 68 77  Resp: 17 14 16 17   Temp:      SpO2: 100% 100% 100%  100%  Weight:      Height:       Eyes: PERRL, lids and conjunctivae normal ENMT: Mucous membranes are moist. Posterior pharynx clear of any exudate or lesions.Normal dentition.  Neck: normal, supple, no masses, no thyromegaly Respiratory: clear to auscultation bilaterally, no wheezing, no crackles. Normal respiratory effort. No accessory muscle use.  Cardiovascular: Regular rate and rhythm, no murmurs / rubs / gallops. No extremity edema. 2+ pedal pulses. No carotid bruits.  Abdomen: no tenderness, no masses palpated. No hepatosplenomegaly. Bowel sounds positive.  Musculoskeletal: no clubbing / cyanosis. No joint deformity upper and lower extremities. Good ROM, no contractures. Normal muscle  tone.  Skin: no rashes, lesions, ulcers. No induration Neurologic: CN 2-12 grossly intact. Sensation intact, DTR normal. Strength 5/5 in all 4.  Psychiatric: Normal judgment and insight. Alert and oriented x 3. Normal mood.     Labs on Admission: I have personally reviewed following labs and imaging studies  CBC: Recent Labs  Lab 12/03/21 1353  WBC 6.3  NEUTROABS 2.7  HGB 12.3*  HCT 37.7*  MCV 90.0  PLT 119   Basic Metabolic Panel: Recent Labs  Lab 12/03/21 1353  NA 136  K 4.1  CL 104  CO2 24  GLUCOSE 94  BUN 22  CREATININE 1.30*  CALCIUM 8.8*   GFR: Estimated Creatinine Clearance: 41.7 mL/min (A) (by C-G formula based on SCr of 1.3 mg/dL (H)). Liver Function Tests: No results for input(s): AST, ALT, ALKPHOS, BILITOT, PROT, ALBUMIN in the last 168 hours. No results for input(s): LIPASE, AMYLASE in the last 168 hours. No results for input(s): AMMONIA in the last 168 hours. Coagulation Profile: Recent Labs  Lab 12/03/21 1353  INR 1.0   Cardiac Enzymes: No results for input(s): CKTOTAL, CKMB, CKMBINDEX, TROPONINI in the last 168 hours. BNP (last 3 results) No results for input(s): PROBNP in the last 8760 hours. HbA1C: No results for input(s): HGBA1C in the last 72 hours. CBG: No results for input(s): GLUCAP in the last 168 hours. Lipid Profile: No results for input(s): CHOL, HDL, LDLCALC, TRIG, CHOLHDL, LDLDIRECT in the last 72 hours. Thyroid Function Tests: No results for input(s): TSH, T4TOTAL, FREET4, T3FREE, THYROIDAB in the last 72 hours. Anemia Panel: No results for input(s): VITAMINB12, FOLATE, FERRITIN, TIBC, IRON, RETICCTPCT in the last 72 hours. Urine analysis: No results found for: COLORURINE, APPEARANCEUR, LABSPEC, PHURINE, GLUCOSEU, HGBUR, BILIRUBINUR, KETONESUR, PROTEINUR, UROBILINOGEN, NITRITE, LEUKOCYTESUR  Radiological Exams on Admission: CT HEAD WO CONTRAST (5MM)  Result Date: 12/03/2021 CLINICAL DATA:  loss of vision in Left eye (temp)  EXAM: CT HEAD WITHOUT CONTRAST TECHNIQUE: Contiguous axial images were obtained from the base of the skull through the vertex without intravenous contrast. COMPARISON:  None. FINDINGS: Brain: Patchy hypodensities in the basal ganglia bilaterally. No evidence of acute hemorrhage, mass lesion, hydrocephalus, extra-axial fluid collection or midline shift. Mild for age atrophy. Vascular: No hyperdense vessel identified. Calcific intracranial atherosclerosis. Skull: No acute fracture. Sinuses/Orbits: Visualized sinuses are clear. Other: No mastoid effusions. IMPRESSION: Patchy hypodensities within the basal ganglia bilaterally. This could potentially represent chronic microvascular ischemic disease; however, in the absence of a prior acute infarct is difficult to exclude. Consider MRI for more sensitive evaluation. Electronically Signed   By: Margaretha Sheffield M.D.   On: 12/03/2021 12:17    EKG: Independently reviewed.  Sinus, no acute ST changes  Assessment/Plan Principal Problem:   TIA (transient ischemic attack)  (please populate well all problems here in Problem List. (For example, if patient  is on BP meds at home and you resume or decide to hold them, it is a problem that needs to be her. Same for CAD, COPD, HLD and so on)   Amaurosis fugax of left eye -Suspect TIA -Brain MRI, MRA of head and neck, depend on results will consult neurology -Echocardiogram and telemetry monitoring for 24 hours. -Continue aspirin and statin -Check A1c and lipid panel -Permissive hypertension x48 hours. -Other differential, MGUS related vision change unlikely given his condition has been stable, complicated migraine is possible but will need to rule out Stroke first.  HTN -As needed hydralazine for now  MGUS -Stable  Migraines -MRI to rule out stroke.  DVT prophylaxis: Lovenox Code Status: Full code Family Communication: Wife and daughter at bedside Disposition Plan: Expect less than 2 midnight hospital  stay Consults called: None Admission status: Telemetry obs   Lequita Halt MD [facility] Pager [phone]  12/03/2021, 6:59 PM

## 2021-12-03 NOTE — ED Notes (Signed)
ED Provider at bedside. 

## 2021-12-03 NOTE — Progress Notes (Addendum)
Patient has arrived to unit. Vital signs stable. Bed in low position, call bell within reach, bed alarm activated. Irving Burton, MD paged.  Melony Overly, RN

## 2021-12-03 NOTE — ED Provider Notes (Signed)
[facility] EMERGENCY DEPT Provider Note   CSN: [id number] Arrival date & time: 12/03/21  1115     History Chief Complaint  Patient presents with   Visiual Loss    Donald Garcia is a 87 y.o. male.  HPI 87 year-old male history of GERD, hyperlipidemia, hypertension, presents today complaining of monocular vision loss that lasted 5 minutes.  He reports that he had painless loss of vision from the left eye that was like an entire clouding of his visual field with a white substance that he was unable to see for 5 minutes.  He had no associated symptoms.  The symptoms have completely resolved.  This occurred earlier today.  He reports he has had episodes of sharp flashing lights in the eyes that he has been told have been migraines in the past.  Today there is no associated headache and there were no flashing lights.  He denies any history of fibrillation or anticoagulation use.    Past Medical History:  Diagnosis Date   GERD (gastroesophageal reflux disease)    Hyperlipemia    Hypertension     Patient Active Problem List   Diagnosis Date Noted   TIA (transient ischemic attack) 12/03/2021    History reviewed. No pertinent surgical history.     No family history on file.  Social History   Tobacco Use   Smoking status: Never   Smokeless tobacco: Never  Substance Use Topics   Alcohol use: Never   Drug use: Never    Home Medications Prior to Admission medications   Medication Sig Start Date End Date Taking? Authorizing Provider  aspirin 81 MG chewable tablet Chew 81 mg by mouth daily. Chew 4 tablets morning of 10-03-21   Yes [provider]  losartan (COZAAR) 25 MG tablet Take 25 mg by mouth daily. evening   Yes [provider]  metoprolol tartrate (LOPRESSOR) 25 MG tablet Take 25 mg by mouth daily.   Yes [provider]  omeprazole (PRILOSEC) 20 MG capsule Take 20 mg by mouth daily. otc   Yes [provider]  simvastatin  (ZOCOR) 20 MG tablet Take 20 mg by mouth daily.   Yes [provider]    Allergies    Lisinopril  Review of Systems   Review of Systems  All other systems reviewed and are negative.  Physical Exam Updated Vital Signs BP (!) 150/81    Pulse 71    Temp 97.6 F (36.4 C)    Resp 17    Ht 1.702 m (5\' 7" )    Wt 88.5 kg    SpO2 99%    BMI 30.54 kg/m   Physical Exam Vitals and nursing note reviewed.  Constitutional:      Appearance: Normal appearance.  HENT:     Head: Normocephalic.     Right Ear: External ear normal.     Left Ear: External ear normal.     Nose: Nose normal.     Mouth/Throat:     Mouth: Mucous membranes are moist.     Pharynx: Oropharynx is clear.  Eyes:     Extraocular Movements: Extraocular movements intact.     Pupils: Pupils are equal, round, and reactive to light.  Cardiovascular:     Rate and Rhythm: Normal rate and regular rhythm.     Pulses: Normal pulses.  Pulmonary:     Effort: Pulmonary effort is normal.  Abdominal:     General: Abdomen is flat.     Palpations: Abdomen  is soft.  Musculoskeletal:        General: Normal range of motion.     Cervical back: Normal range of motion.  Skin:    General: Skin is warm and dry.     Capillary Refill: Capillary refill takes less than 2 seconds.  Neurological:     General: No focal deficit present.     Mental Status: He is alert and oriented to person, place, and time.     Sensory: No sensory deficit.     Motor: No weakness.     Coordination: Coordination normal.     Deep Tendon Reflexes: Reflexes normal.     Comments: Mild asymmetry of the mouth.  Patient states this has been present since he had Bell's palsy many years ago.  Psychiatric:        Mood and Affect: Mood normal.        Behavior: Behavior normal.    ED Results / Procedures / Treatments   Labs (all labs ordered are listed, but only abnormal results are displayed) Labs Reviewed  CBC WITH DIFFERENTIAL/PLATELET - Abnormal; Notable  for the following components:      Result Value   RBC 4.19 (*)    Hemoglobin 12.3 (*)    HCT 37.7 (*)    All other components within normal limits  BASIC METABOLIC PANEL - Abnormal; Notable for the following components:   Creatinine, Ser 1.30 (*)    Calcium 8.8 (*)    GFR, Estimated 53 (*)    All other components within normal limits  RESP PANEL BY RT-PCR (FLU A&B, COVID) [id number]  PROTIME-INR  RAPID URINE DRUG SCREEN, HOSP PERFORMED    EKG EKG Interpretation  Date/Time:  Wednesday December 03 2021 13:38:22 EST Ventricular Rate:  69 PR Interval:  186 QRS Duration: 90 QT Interval:  410 QTC Calculation: 440 R Axis:   12 Text Interpretation: Sinus rhythm Low voltage, precordial leads Abnormal R-wave progression, early transition Confirmed by Margarita Grizzle [phone]) on 12/03/2021 2:21:21 PM  Radiology CT HEAD WO CONTRAST ( )  Result Date: 12/03/2021 CLINICAL DATA:  loss of vision in Left eye (temp) EXAM: CT HEAD WITHOUT CONTRAST TECHNIQUE: Contiguous axial images were obtained from the base of the skull through the vertex without intravenous contrast. COMPARISON:  None. FINDINGS: Brain: Patchy hypodensities in the basal ganglia bilaterally. No evidence of acute hemorrhage, mass lesion, hydrocephalus, extra-axial fluid collection or midline shift. Mild for age atrophy. Vascular: No hyperdense vessel identified. Calcific intracranial atherosclerosis. Skull: No acute fracture. Sinuses/Orbits: Visualized sinuses are clear. Other: No mastoid effusions. IMPRESSION: Patchy hypodensities within the basal ganglia bilaterally. This could potentially represent chronic microvascular ischemic disease; however, in the absence of a prior acute infarct is difficult to exclude. Consider MRI for more sensitive evaluation. Electronically Signed   By: Feliberto Harts M.D.   On: 12/03/2021 12:17    Procedures Procedures   Medications Ordered in ED Medications - No data to display  ED Course  I have  reviewed the triage vital signs and the nursing notes.  Pertinent labs & imaging results that were available during my care of the patient were reviewed by me and considered in my medical decision making (see chart for details).    MDM Rules/Calculators/A&P                         Patient with monocular vision loss concerning for amaurosis fugax.  Given age and comorbidities, concerning for stroke versus TIA.  CT obtained from triage which is personally reviewed and shows some hypodensities in the basal ganglia this is consistent with radiologist interpretation.  Otherwise patient has remained hemodynamically stable.  Labs, EKG, are ordered.  Is ordered due to concerns of stroke versus TIA as well as other metabolic abnormalities which could cause symptoms. History obtained from patient and reviewed outpatient records from [facility]. CBC reviewed and interpreted and within normal limits EKG reviewed and interpreted and normal sinus rhythm with poor R wave progression Discussed results of above with patient, wife, daughter. Patient with monocular vision with normal exam now. Discussed care with Dr. Chipper Herb, on-call for hospitalist. He has accepted the patient in transfer for admission for ongoing TIA work-up.    Final Clinical Impression(s) / ED Diagnoses Final diagnoses:  TIA (transient ischemic attack)  Monocular vision loss    Rx / DC Orders ED Discharge Orders     None        Margarita Grizzle, MD 12/03/21 1458

## 2021-12-04 ENCOUNTER — Observation Stay (HOSPITAL_BASED_OUTPATIENT_CLINIC_OR_DEPARTMENT_OTHER): Payer: Medicare Other

## 2021-12-04 DIAGNOSIS — E7800 Pure hypercholesterolemia, unspecified: Secondary | ICD-10-CM

## 2021-12-04 DIAGNOSIS — G459 Transient cerebral ischemic attack, unspecified: Secondary | ICD-10-CM

## 2021-12-04 DIAGNOSIS — I10 Essential (primary) hypertension: Secondary | ICD-10-CM

## 2021-12-04 DIAGNOSIS — G43109 Migraine with aura, not intractable, without status migrainosus: Secondary | ICD-10-CM

## 2021-12-04 LAB — LIPID PANEL
Cholesterol: 142 mg/dL (ref 0–200)
HDL: 36 mg/dL — ABNORMAL LOW (ref >40)
LDL Cholesterol: 78 mg/dL (ref 0–99)
Total CHOL/HDL Ratio: 3.9 RATIO
Triglycerides: 139 mg/dL (ref <150)
VLDL: 28 mg/dL (ref 0–40)

## 2021-12-04 LAB — HEMOGLOBIN A1C
Hgb A1c MFr Bld: 5.9 % — ABNORMAL HIGH (ref 4.8–5.6)
Mean Plasma Glucose: 122.63 mg/dL

## 2021-12-04 LAB — ECHOCARDIOGRAM COMPLETE
Area-P 1/2: 3.25 cm2
Calc EF: 73.7 %
Height: 67 in
P 1/2 time: 842 msec
S' Lateral: 2.7 cm
Single Plane A2C EF: 76.7 %
Single Plane A4C EF: 68.6 %
Weight: 3120 oz

## 2021-12-04 MED ORDER — METOPROLOL SUCCINATE ER 25 MG PO TB24
25.0000 mg | ORAL_TABLET | Freq: Every day | ORAL | Status: DC
  Administered 2021-12-04: 10:00:00 25 mg via ORAL
  Filled 2021-12-04: qty 1

## 2021-12-04 MED ORDER — CLOPIDOGREL BISULFATE 75 MG PO TABS
75.0000 mg | ORAL_TABLET | Freq: Every day | ORAL | 0 refills | Status: AC
Start: 2021-12-04 — End: 2021-12-25

## 2021-12-04 MED ORDER — ASPIRIN 81 MG PO CHEW: 81.0000 mg | CHEWABLE_TABLET | Freq: Every day | ORAL | 2 refills | Status: AC

## 2021-12-04 MED ORDER — CLOPIDOGREL BISULFATE 75 MG PO TABS
75.0000 mg | ORAL_TABLET | Freq: Every day | ORAL | Status: DC
  Administered 2021-12-04: 16:00:00 75 mg via ORAL
  Filled 2021-12-04: qty 1

## 2021-12-04 NOTE — Progress Notes (Signed)
Physician Discharge Summary  Donald Garcia QZR:[id number] DOB: 10/28/1931 DOA: 12/03/2021  PCP: System, Provider Not In  Admit date: 12/03/2021 Discharge date: 12/04/2021  Admitted From: Home Discharge disposition: Home   Code Status: Full Code   Discharge Diagnosis:   Principal Problem:   TIA (transient ischemic attack)    Chief Complaint  Patient presents with   Visiual Loss    Brief narrative: Donald Garcia is a 87 y.o. male with PMH significant for HTN, HLD, MGUS, migraines, GERD. Patient presented to the ED on 12/28 for temporary vision loss. On 12/28, he was walking inside the house and started to have 'clouds in the left visual field' soon after which he had complete lost vision on the left side. After 4-5 minutes his vision on the left side recovered. At baseline, his right eye has poor vision compared to the left.   In the ED, vital signs stable, blood pressure 147/93 Labs with creatinine elevated 1.3, hemoglobin 12.3. CT head showed patchy hypodensities within the basal ganglia bilaterally suggestive of chronic microvascular ischemic disease.  MRI brain did not show any acute intracranial abnormality. MRA head and neck did not show any large vessel occlusion or high-grade stenosis.  Subjective: Patient was seen and examined this morning.  [name].  Not in distress.  Sitting at the edge of the bed.  Family at bedside.  Hospital course: TIA Amaurosis fugax of left eye -Symptoms resolved in few minutes of onset -Stroke work-up in process. -Imagings as above. -A1c 5.9, HDL 36, LDL 78 -Echo with EF 65 to 70%, mild concentric LVH, grade 1 diastolic dysfunction. -Prior to admission, patient was on simvastatin 20 mg daily.  He states that he was supposed to be on aspirin as well but he has not taken that for a long time and its not because of any side effect.  -Seen by neurology.  Recommended aspirin 81 mg daily and Plavix and famotidine daily for  next 3 weeks followed by aspirin alone. -Seen by PT/OT.  No follow-up recommendation  Essential hypertension -Prior to admission patient was on metoprolol 25 mg daily and losartan 25 mg daily.   -Continue the same.  Migraine -Patient has history of migraines, which he describes as flashing point in visual field, as frequent as once a week, resolves by itself in few minutes, no accompanying headache or weakness nor numbness of any of the limbs.  This month, he had had two such episode.    MGUS -MGUS was diagnosed in 2009, had a bone marrow biopsy in 2019.  Has been following with hematologist in [place], according to patient and his family, his condition has been stable, never had chemotherapy.  Most recent visit was 1 month ago and was told condition was stable.  Discharge Medications:   Allergies as of 12/04/2021       Reactions   Lisinopril         Medication List     TAKE these medications    aspirin 81 MG chewable tablet Chew 1 tablet (81 mg total) by mouth daily. Start taking on: December 05, 2021 What changed: additional instructions   clopidogrel 75 MG tablet Commonly known as: PLAVIX Take 1 tablet (75 mg total) by mouth daily for 21 days.   losartan 25 MG tablet Commonly known as: COZAAR Take 25 mg by mouth daily. evening   metoprolol tartrate 25 MG tablet Commonly known as: LOPRESSOR Take 25 mg by mouth daily.   omeprazole 20 MG  capsule Commonly known as: PRILOSEC Take 20 mg by mouth daily. otc   simvastatin 20 MG tablet Commonly known as: ZOCOR Take 20 mg by mouth daily.        Wound care:     Discharge Instructions:   Discharge Instructions     Call MD for:  difficulty breathing, headache or visual disturbances   Complete by: As directed    Call MD for:  extreme fatigue   Complete by: As directed    Call MD for:  hives   Complete by: As directed    Call MD for:  persistant dizziness or light-headedness   Complete by: As directed     Call MD for:  persistant nausea and vomiting   Complete by: As directed    Call MD for:  severe uncontrolled pain   Complete by: As directed    Call MD for:  temperature >100.4   Complete by: As directed    Diet general   Complete by: As directed    Discharge instructions   Complete by: As directed    General discharge instructions:  Follow with Primary MD System, Provider Not In in 7 days   Get CBC/BMP checked in next visit within 1 week by PCP or SNF MD. (We routinely change or add medications that can affect your baseline labs and fluid status, therefore we recommend that you get the mentioned basic workup next visit with your PCP, your PCP may decide not to get them or add new tests based on their clinical decision)  On your next visit with your PCP, please get your medicines reviewed and adjusted.  Please request your PCP  to go over all hospital tests, procedures, radiology results at the follow up, please get all Hospital records sent to your PCP by signing hospital release before you go home.  Activity: As tolerated with Full fall precautions use walker/cane & assistance as needed  Avoid using any recreational substances like cigarette, tobacco, alcohol, or non-prescribed drug.  If you experience worsening of your admission symptoms, develop shortness of breath, life threatening emergency, suicidal or homicidal thoughts you must seek medical attention immediately by calling 911 or calling your MD immediately  if symptoms less severe.  You must read complete instructions/literature along with all the possible adverse reactions/side effects for all the medicines you take and that have been prescribed to you. Take any new medicine only after you have completely understood and accepted all the possible adverse reactions/side effects.   Do not drive, operate heavy machinery, perform activities at heights, swimming or participation in water activities or provide baby sitting services  if your were admitted for syncope or siezures until you have seen by Primary MD or a Neurologist and advised to do so again.  Do not drive when taking Pain medications.  Do not take more than prescribed Pain, Sleep and Anxiety Medications  Wear Seat belts while driving.  Please note You were cared for by a hospitalist during your hospital stay. If you have any questions about your discharge medications or the care you received while you were in the hospital after you are discharged, you can call the unit and asked to speak with the hospitalist on call if the hospitalist that took care of you is not available. Once you are discharged, your primary care physician will handle any further medical issues. Please note that NO REFILLS for any discharge medications will be authorized once you are discharged, as it is imperative that you  return to your primary care physician (or establish a relationship with a primary care physician if you do not have one) for your aftercare needs so that they can reassess your need for medications and monitor your lab values.   Increase activity slowly   Complete by: As directed        Follow ups:    Follow-up [facility].   Contact information: [place] [id number] [place] .   Contact information: [address]     [address] [place] [address] [phone]                Discharge Exam:   Vitals:   12/04/21 0120 12/04/21 0356 12/04/21 0743 12/04/21 1244  BP: 128/79 (!) 149/99 (!) 166/88 127/73  Pulse:  82  73  Resp: _0 Temp: 98 F (36.7 C) 97.9 F (36.6 C) 97.8 F (36.6 C) 98.3 F (36.8 C)  TempSrc: Oral Oral Oral Oral  SpO2: 99% 99%  98%  Weight:      Height:        Body mass index is 30.54 kg/m.  General exam: Pleasant, elderly Caucasian male.  Not in physical  distress Skin: No rashes, lesions or ulcers. HEENT: Atraumatic, normocephalic, no obvious bleeding Lungs: Clear to auscultation bilaterally CVS: Regular rate and rhythm, no murmur GI/Abd soft, nontender, nondistended, bowel sound present CNS: Alert, awake, oriented x3.  Chronically impaired vision on the right eye.  Left eye vision intact today. Psychiatry: Mood appropriate Extremities: No pedal edema, no calf tenderness  Time coordinating discharge: 35 minutes   The results of significant diagnostics from this hospitalization (including imaging, microbiology, ancillary and laboratory) are listed below for reference.    Procedures and Diagnostic Studies:   CT HEAD WO CONTRAST (5MM)  Result Date: 12/03/2021 CLINICAL DATA:  loss of vision in Left eye (temp) EXAM: CT HEAD WITHOUT CONTRAST TECHNIQUE: Contiguous axial images were obtained from the base of the skull through the vertex without intravenous contrast. COMPARISON:  None. FINDINGS: Brain: Patchy hypodensities in the basal ganglia bilaterally. No evidence of acute hemorrhage, mass lesion, hydrocephalus, extra-axial fluid collection or midline shift. Mild for age atrophy. Vascular: No hyperdense vessel identified. Calcific intracranial atherosclerosis. Skull: No acute fracture. Sinuses/Orbits: Visualized sinuses are clear. Other: No mastoid effusions. IMPRESSION: Patchy hypodensities within the basal ganglia bilaterally. This could potentially represent chronic microvascular ischemic disease; however, in the absence of a prior acute infarct is difficult to exclude. Consider MRI for more sensitive evaluation. Electronically Signed   By: Margaretha Sheffield M.D.   On: 12/03/2021 12:17   MR ANGIO HEAD WO CONTRAST  Result Date: 12/03/2021 CLINICAL DATA:  Stroke follow-up EXAM: MRI HEAD WITHOUT CONTRAST MRA HEAD WITHOUT CONTRAST MRA NECK WITHOUT CONTRAST TECHNIQUE: Multiplanar, multiecho pulse sequences of the brain and surrounding structures  were obtained without intravenous contrast. Angiographic images of the Circle of Willis were obtained using MRA technique without intravenous contrast. Angiographic images of the neck were obtained using MRA technique without intravenous contrast. Carotid stenosis measurements (when applicable) are obtained utilizing NASCET criteria, using the distal internal carotid diameter as the denominator. COMPARISON:  None. FINDINGS: MRI HEAD FINDINGS Brain: No acute infarct, mass effect or extra-axial collection. No acute or chronic hemorrhage. There is multifocal hyperintense T2-weighted signal within the white matter. Generalized volume loss without a clear  lobar predilection. If the midline structures are normal. Vascular: Major flow voids are preserved. Skull and upper cervical spine: Normal calvarium and skull base. Visualized upper cervical spine and soft tissues are normal. Sinuses/Orbits:No paranasal sinus fluid levels or advanced mucosal thickening. No mastoid or middle ear effusion. Normal orbits. MRA HEAD FINDINGS POSTERIOR CIRCULATION: --Vertebral arteries: Normal --Inferior cerebellar arteries: Normal. --Basilar artery: Normal. --Superior cerebellar arteries: Normal. --Posterior cerebral arteries: Normal. ANTERIOR CIRCULATION: --Intracranial internal carotid arteries: There is a medially projecting aneurysm measuring 2 x 2 mm arising from the distal cavernous segment of the right internal artery. Additionally, there is a superiorly and laterally projecting aneurysm in the same location measuring 3 x 2 mm. The left internal carotid artery is normal. --Anterior cerebral arteries (ACA): Normal. --Middle cerebral arteries (MCA): Normal. ANATOMIC VARIANTS: Fetal origin of the left PCA. MRA NECK FINDINGS Three vessel branch pattern of the aorta. Right dominant vertebral arteries. The left vertebral artery V1 segment shows no flow related enhancement. The right vertebral artery is normal. The carotid systems are  normal. IMPRESSION: 1. No acute intracranial abnormality. 2. Mild chronic small vessel disease and volume loss. 3. No emergent large vessel occlusion or high-grade stenosis. 4. 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 Electronically Signed   By: Ulyses Jarred M.D.   On: 12/03/2021 22:07   MR ANGIO NECK WO CONTRAST  Result Date: 12/03/2021 CLINICAL DATA:  Stroke follow-up EXAM: MRI HEAD WITHOUT CONTRAST MRA HEAD WITHOUT CONTRAST MRA NECK WITHOUT CONTRAST TECHNIQUE: Multiplanar, multiecho pulse sequences of the brain and surrounding structures were obtained without intravenous contrast. Angiographic images of the Circle of Willis were obtained using MRA technique without intravenous contrast. Angiographic images of the neck were obtained using MRA technique without intravenous contrast. Carotid stenosis measurements (when applicable) are obtained utilizing NASCET criteria, using the distal internal carotid diameter as the denominator. COMPARISON:  None. FINDINGS: MRI HEAD FINDINGS Brain: No acute infarct, mass effect or extra-axial collection. No acute or chronic hemorrhage. There is multifocal hyperintense T2-weighted signal within the white matter. Generalized volume loss without a clear lobar predilection. If the midline structures are normal. Vascular: Major flow voids are preserved. Skull and upper cervical spine: Normal calvarium and skull base. Visualized upper cervical spine and soft tissues are normal. Sinuses/Orbits:No paranasal sinus fluid levels or advanced mucosal thickening. No mastoid or middle ear effusion. Normal orbits. MRA HEAD FINDINGS POSTERIOR CIRCULATION: --Vertebral arteries: Normal --Inferior cerebellar arteries: Normal. --Basilar artery: Normal. --Superior cerebellar arteries: Normal. --Posterior cerebral arteries: Normal. ANTERIOR CIRCULATION: --Intracranial internal carotid arteries: There is a  medially projecting aneurysm measuring 2 x 2 mm arising from the distal cavernous segment of the right internal artery. Additionally, there is a superiorly and laterally projecting aneurysm in the same location measuring 3 x 2 mm. The left internal carotid artery is normal. --Anterior cerebral arteries (ACA): Normal. --Middle cerebral arteries (MCA): Normal. ANATOMIC VARIANTS: Fetal origin of the left PCA. MRA NECK FINDINGS Three vessel branch pattern of the aorta. Right dominant vertebral arteries. The left vertebral artery V1 segment shows no flow related enhancement. The right vertebral artery is normal. The carotid systems are normal. IMPRESSION: 1. No acute intracranial abnormality. 2. Mild chronic small vessel disease and volume loss. 3. No emergent large vessel occlusion or high-grade stenosis. 4. 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 Electronically  Signed   By: Ulyses Jarred M.D.   On: 12/03/2021 22:07   MR BRAIN WO CONTRAST  Result Date: 12/03/2021 CLINICAL DATA:  Stroke follow-up EXAM: MRI HEAD WITHOUT CONTRAST MRA HEAD WITHOUT CONTRAST MRA NECK WITHOUT CONTRAST TECHNIQUE: Multiplanar, multiecho pulse sequences of the brain and surrounding structures were obtained without intravenous contrast. Angiographic images of the Circle of Willis were obtained using MRA technique without intravenous contrast. Angiographic images of the neck were obtained using MRA technique without intravenous contrast. Carotid stenosis measurements (when applicable) are obtained utilizing NASCET criteria, using the distal internal carotid diameter as the denominator. COMPARISON:  None. FINDINGS: MRI HEAD FINDINGS Brain: No acute infarct, mass effect or extra-axial collection. No acute or chronic hemorrhage. There is multifocal hyperintense T2-weighted signal within the white matter. Generalized volume loss without a clear lobar  predilection. If the midline structures are normal. Vascular: Major flow voids are preserved. Skull and upper cervical spine: Normal calvarium and skull base. Visualized upper cervical spine and soft tissues are normal. Sinuses/Orbits:No paranasal sinus fluid levels or advanced mucosal thickening. No mastoid or middle ear effusion. Normal orbits. MRA HEAD FINDINGS POSTERIOR CIRCULATION: --Vertebral arteries: Normal --Inferior cerebellar arteries: Normal. --Basilar artery: Normal. --Superior cerebellar arteries: Normal. --Posterior cerebral arteries: Normal. ANTERIOR CIRCULATION: --Intracranial internal carotid arteries: There is a medially projecting aneurysm measuring 2 x 2 mm arising from the distal cavernous segment of the right internal artery. Additionally, there is a superiorly and laterally projecting aneurysm in the same location measuring 3 x 2 mm. The left internal carotid artery is normal. --Anterior cerebral arteries (ACA): Normal. --Middle cerebral arteries (MCA): Normal. ANATOMIC VARIANTS: Fetal origin of the left PCA. MRA NECK FINDINGS Three vessel branch pattern of the aorta. Right dominant vertebral arteries. The left vertebral artery V1 segment shows no flow related enhancement. The right vertebral artery is normal. The carotid systems are normal. IMPRESSION: 1. No acute intracranial abnormality. 2. Mild chronic small vessel disease and volume loss. 3. No emergent large vessel occlusion or high-grade stenosis. 4. 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 Electronically Signed   By: Ulyses Jarred M.D.   On: 12/03/2021 22:07   ECHOCARDIOGRAM COMPLETE  Result Date: 12/04/2021    ECHOCARDIOGRAM REPORT   Patient Name:   Donald Garcia Date of Exam: 12/04/2021 Medical Rec #:  [id number]  Height:       67.0 in Accession #:    [id number] Weight:       195.0 lb Date of Birth:  [birth year]   BSA:          2.001 m Patient  Age:    56 years   BP:           166/88 mmHg Patient Gender: M          HR:           83 bpm. Exam Location:  Inpatient Procedure: 2D Echo, 3D Echo, Cardiac Doppler and Color Doppler Indications:    TIA  History:        Patient has no prior history of Echocardiogram examinations.                 Risk Factors:Hypertension and Dyslipidemia. Migraines.  Sonographer:    Roseanna Rainbow RDCS Referring Phys: [id number] [place] Comments: Image acquisition challenging due to patient body habitus. IMPRESSIONS  1. Left ventricular ejection fraction, by estimation, is 65 to 70%.  The left ventricle has normal function. The left ventricle has no regional wall motion abnormalities. There is mild concentric left ventricular hypertrophy. Left ventricular diastolic parameters are consistent with Grade I diastolic dysfunction (impaired relaxation).  2. Right ventricular systolic function is normal. The right ventricular size is normal. There is normal pulmonary artery systolic pressure.  3. The mitral valve is grossly normal. No evidence of mitral valve regurgitation. No evidence of mitral stenosis.  4. The aortic valve is tricuspid. Aortic valve regurgitation is mild. No aortic stenosis is present. Aortic regurgitation PHT measures 842 msec.  5. The inferior vena cava is dilated in size with >50% respiratory variability, suggesting right atrial pressure of 8 mmHg. FINDINGS  Left Ventricle: Left ventricular ejection fraction, by estimation, is 65 to 70%. The left ventricle has normal function. The left ventricle has no regional wall motion abnormalities. The left ventricular internal cavity size was normal in size. There is  mild concentric left ventricular hypertrophy. Left ventricular diastolic parameters are consistent with Grade I diastolic dysfunction (impaired relaxation). Indeterminate filling pressures. Right Ventricle: The right ventricular size is normal. No increase in right ventricular wall thickness. Right  ventricular systolic function is normal. There is normal pulmonary artery systolic pressure. The tricuspid regurgitant velocity is 1.98 m/s, and  with an assumed right atrial pressure of 8 mmHg, the estimated right ventricular systolic pressure is 74.0 mmHg. Left Atrium: Left atrial size was normal in size. Right Atrium: Right atrial size was normal in size. Pericardium: There is no evidence of pericardial effusion. Mitral Valve: The mitral valve is grossly normal. There is mild thickening of the mitral valve leaflet(s). No evidence of mitral valve regurgitation. No evidence of mitral valve stenosis. Tricuspid Valve: The tricuspid valve is normal in structure. Tricuspid valve regurgitation is trivial. No evidence of tricuspid stenosis. Aortic Valve: The aortic valve is tricuspid. Aortic valve regurgitation is mild. Aortic regurgitation PHT measures 842 msec. No aortic stenosis is present. Pulmonic Valve: The pulmonic valve was normal in structure. Pulmonic valve regurgitation is not visualized. No evidence of pulmonic stenosis. Aorta: The aortic root is normal in size and structure. Venous: The inferior vena cava is dilated in size with greater than 50% respiratory variability, suggesting right atrial pressure of 8 mmHg. IAS/Shunts: No atrial level shunt detected by color flow Doppler.  LEFT VENTRICLE PLAX 2D LVIDd:         3.80 cm     Diastology LVIDs:         2.70 cm     LV e' medial:    4.68 cm/s LV PW:         1.20 cm     LV E/e' medial:  12.7 LV IVS:        1.10 cm     LV e' lateral:   7.29 cm/s LVOT diam:     2.00 cm     LV E/e' lateral: 8.2 LV SV:         56 LV SV Index:   28 LVOT Area:     3.14 cm                             3D Volume EF: LV Volumes (MOD)           3D EF:        66 % LV vol d, MOD A2C: 55.8 ml LV EDV:       122 ml LV vol d,  MOD A4C: 55.4 ml LV ESV:       41 ml LV vol s, MOD A2C: 13.0 ml LV SV:        81 ml LV vol s, MOD A4C: 17.4 ml LV SV MOD A2C:     42.8 ml LV SV MOD A4C:     55.4 ml LV  SV MOD BP:      43.5 ml RIGHT VENTRICLE             IVC RV S prime:     15.00 cm/s  IVC diam: 2.10 cm TAPSE (M-mode): 1.7 cm LEFT ATRIUM             Index        RIGHT ATRIUM          Index LA diam:        4.00 cm 2.00 cm/m   RA Area:     8.50 cm LA Vol (A2C):   28.1 ml 14.05 ml/m  RA Volume:   14.80 ml 7.40 ml/m LA Vol (A4C):   19.4 ml 9.70 ml/m LA Biplane Vol: 23.9 ml 11.95 ml/m  AORTIC VALVE LVOT Vmax:   101.90 cm/s LVOT Vmean:  64.350 cm/s LVOT VTI:    0.178 m AI PHT:      842 msec  AORTA Ao Root diam: 3.40 cm Ao Asc diam:  3.20 cm MITRAL VALVE               TRICUSPID VALVE MV Area (PHT): 3.25 cm    TR Peak grad:   15.7 mmHg MV Decel Time: 234 msec    TR Vmax:        198.00 cm/s MV E velocity: 59.50 cm/s MV A velocity: 79.93 cm/s  SHUNTS MV E/A ratio:  0.74        Systemic VTI:  0.18 m                            Systemic Diam: 2.00 cm Skeet Latch MD Electronically signed by Skeet Latch MD Signature Date/Time: 12/04/2021/11:30:05 AM    Final      Labs:   Basic Metabolic Panel: Recent Labs  Lab 12/03/21 1353  NA 136  K 4.1  CL 104  CO2 24  GLUCOSE 94  BUN 22  CREATININE 1.30*  CALCIUM 8.8*   GFR Estimated Creatinine Clearance: 41.7 mL/min (A) (by C-G formula based on SCr of 1.3 mg/dL (H)). Liver Function Tests: No results for input(s): AST, ALT, ALKPHOS, BILITOT, PROT, ALBUMIN in the last 168 hours. No results for input(s): LIPASE, AMYLASE in the last 168 hours. No results for input(s): AMMONIA in the last 168 hours. Coagulation profile Recent Labs  Lab 12/03/21 1353  INR 1.0    CBC: Recent Labs  Lab 12/03/21 1353  WBC 6.3  NEUTROABS 2.7  HGB 12.3*  HCT 37.7*  MCV 90.0  PLT 252   Cardiac Enzymes: No results for input(s): CKTOTAL, CKMB, CKMBINDEX, TROPONINI in the last 168 hours. BNP: Invalid input(s): POCBNP CBG: No results for input(s): GLUCAP in the last 168 hours. D-Dimer No results for input(s): DDIMER in the last 72 hours. Hgb A1c Recent  Labs    12/04/21 0217  HGBA1C 5.9*   Lipid Profile Recent Labs    12/04/21 0217  CHOL 142  HDL 36*  LDLCALC 78  TRIG 139  CHOLHDL 3.9   Thyroid function studies No results for input(s): TSH, T4TOTAL, T3FREE, THYROIDAB  in the last 72 hours.  Invalid input(s): FREET3 Anemia work up No results for input(s): VITAMINB12, FOLATE, FERRITIN, TIBC, IRON, RETICCTPCT in the last 72 hours. Microbiology Recent Results (from the past 240 hour(s))  Resp Panel by RT-PCR (Flu A&B, Covid) Nasopharyngeal Swab     Status: None   Collection Time: 12/03/21  3:27 PM   Specimen: Nasopharyngeal Swab; Nasopharyngeal(NP) swabs in vial transport medium  Result Value Ref Range Status   SARS Coronavirus 2 by RT PCR NEGATIVE NEGATIVE Final    Comment: (NOTE) SARS-CoV-2 target nucleic acids are NOT DETECTED.  The SARS-CoV-2 RNA is generally detectable in upper respiratory specimens during the acute phase of infection. The lowest concentration of SARS-CoV-2 viral copies this assay can detect is 138 copies/mL. A negative result does not preclude SARS-Cov-2 infection and should not be used as the sole basis for treatment or other patient management decisions. A negative result may occur with  improper specimen collection/handling, submission of specimen other than nasopharyngeal swab, presence of viral mutation(s) within the areas targeted by this assay, and inadequate number of viral copies(<138 copies/mL). A negative result must be combined with clinical observations, patient history, and epidemiological information. The expected result is Negative.  Fact Sheet for Patients:  EntrepreneurPulse.com.au  Fact Sheet for Healthcare Providers:  IncredibleEmployment.be  This test is no t yet approved or cleared by the [place] FDA and  has been authorized for detection and/or diagnosis of SARS-CoV-2 by FDA under an Emergency Use Authorization (EUA). This EUA will  remain  in effect (meaning this test can be used) for the duration of the COVID-19 declaration under Section 564(b)(1) of the Act, 21 U.S.C.section 360bbb-3(b)(1), unless the authorization is terminated  or revoked sooner.       Influenza A by PCR NEGATIVE NEGATIVE Final   Influenza B by PCR NEGATIVE NEGATIVE Final    Comment: (NOTE) The Xpert Xpress SARS-CoV-2/FLU/RSV plus assay is intended as an aid in the diagnosis of influenza from Nasopharyngeal swab specimens and should not be used as a sole basis for treatment. Nasal washings and aspirates are unacceptable for Xpert Xpress SARS-CoV-2/FLU/RSV testing.  Fact Sheet for Patients: EntrepreneurPulse.com.au  Fact Sheet for Healthcare Providers: IncredibleEmployment.be  This test is not yet approved or cleared by the [place] FDA and has been authorized for detection and/or diagnosis of SARS-CoV-2 by FDA under an Emergency Use Authorization (EUA). This EUA will remain in effect (meaning this test can be used) for the duration of the COVID-19 declaration under Section 564(b)(1) of the Act, 21 U.S.C. section 360bbb-3(b)(1), unless the authorization is terminated or revoked.  Performed at [facility], [address], [place], [place] [address]      Signed: Terrilee Croak  [facility] 12/04/2021, 4:01 PM

## 2021-12-04 NOTE — Progress Notes (Incomplete)
Taking over for Janique, RN as main RN until 7 pm. Pt is in bed and stable, waiting for discharge orders. Family at bedside.

## 2021-12-04 NOTE — Evaluation (Signed)
Occupational Therapy Evaluation Patient Details Name: Donald Garcia MRN: [id number] DOB: 1931/03/16 Today's Date: 12/04/2021   History of Present Illness 87 y.o. M admitted on 12/28 due to visual changes in his L eye. CT and MRI are negative, pt being worked up for a TIA. PMH significant for HTN, HLD, MGUS, GERD, and migraines.   Clinical Impression   Pt admitted for concerns listed above. PTA Pt reported that he was independent with all ADL's and IADL's, including traveling with his wife. At this time, pt appears back to baseline with no difficulties. Vision and cognition WFL. Pt has no further OT needs and acute OT will sign off.       Recommendations for follow up therapy are one component of a multi-disciplinary discharge planning process, led by the attending physician.  Recommendations may be updated based on patient status, additional functional criteria and insurance authorization.   Follow Up Recommendations  No OT follow up    Assistance Recommended at Discharge None  Functional Status Assessment  Patient has not had a recent decline in their functional status  Equipment Recommendations  None recommended by OT    Recommendations for Other Services       Precautions / Restrictions Precautions Precautions: None Restrictions Weight Bearing Restrictions: No      Mobility Bed Mobility               General bed mobility comments: Pt up walking the halls with RN    Transfers Overall transfer level: Independent Equipment used: None                      Balance                                           ADL either performed or assessed with clinical judgement   ADL Overall ADL's : Independent;At baseline                                       General ADL Comments: No needs, pt is indep with all ADL;s     Vision Baseline Vision/History: 1 Wears glasses Ability to See in Adequate Light: 0 Adequate Patient Visual  Report: Blurring of vision Vision Assessment?: No apparent visual deficits Additional Comments: Vision has returned to normal, pt has no more blurring, peripherals are [facility], as are saccades, convergence, and tracking.     Perception     Praxis      Pertinent Vitals/Pain Pain Assessment: No/denies pain     Hand Dominance Right   Extremity/Trunk Assessment Upper Extremity Assessment Upper Extremity Assessment: Overall WFL for tasks assessed   Lower Extremity Assessment Lower Extremity Assessment: Overall WFL for tasks assessed   Cervical / Trunk Assessment Cervical / Trunk Assessment: Normal   Communication Communication Communication: No difficulties   Cognition Arousal/Alertness: Awake/alert Behavior During Therapy: WFL for tasks assessed/performed Overall Cognitive Status: Within Functional Limits for tasks assessed                                       General Comments  VSS on RA    Exercises     Shoulder Instructions      Home Living Family/patient  expects to be discharged to:: Private residence Living Arrangements: Spouse/significant other Available Help at Discharge: Family;Available 24 hours/day Type of Home: House Home Access: Stairs to enter [employer] of Steps: 2 steps   Home Layout: Two level Alternate Level Stairs-Number of Steps: full flight   Bathroom Shower/Tub: Chief Strategy Officer: Standard     Home Equipment: None   Additional Comments: Pt from [place], visiting family in [place] and [place] at this time, before returning to the winter condo in [place].      Prior Functioning/Environment Prior Level of Function : Independent/Modified Independent                        OT Problem List: Impaired balance (sitting and/or standing);Impaired vision/perception      OT Treatment/Interventions:      OT Goals(Current goals can be found in the care plan section) Acute Rehab OT Goals Patient Stated  Goal: To go home OT Goal Formulation: All assessment and education complete, DC therapy Time For Goal Achievement: 12/04/21 Potential to Achieve Goals: Good  OT Frequency:     Barriers to D/C:            Co-evaluation              AM-PAC OT "6 Clicks" Daily Activity     Outcome Measure Help from another person eating meals?: None Help from another person taking care of personal grooming?: None Help from another person toileting, which includes using toliet, bedpan, or urinal?: None Help from another person bathing (including washing, rinsing, drying)?: None Help from another person to put on and taking off regular upper body clothing?: None Help from another person to put on and taking off regular lower body clothing?: None 6 Click Score: 24   End of Session Nurse Communication: Mobility status  Activity Tolerance: Patient tolerated treatment well Patient left: in chair;with call bell/phone within reach;with family/visitor present  OT Visit Diagnosis: Muscle weakness (generalized) (M62.81);Low vision, both eyes (H54.2)                Time: 8338-2505 OT Time Calculation (min): 11 min Charges:  OT General Charges $OT Visit: 1 Visit OT Evaluation $OT Eval Low Complexity: 1 Low  Rolla Servidio H., OTR/L Acute Rehabilitation  Mea Ozga Elane Bing Plume 12/04/2021, 10:55 AM

## 2021-12-04 NOTE — Progress Notes (Signed)
PT Cancellation Note  Patient Details Name: Donald Garcia MRN: [id number] DOB: Sep 23, 1931   Cancelled Treatment:    Reason Eval/Treat Not Completed: PT screened, no needs identified, will sign off.  Per OT, no PT needs. 12/04/2021  Jacinto Halim., PT Acute Rehabilitation Services [phone]  (pager) [phone]  (office)   Eliseo Gum Christan Ciccarelli 12/04/2021, 10:12 AM

## 2021-12-04 NOTE — Progress Notes (Signed)
SLP Cancellation Note  Patient Details Name: Fuad Forget MRN: [id number] DOB: 1931-07-15   Cancelled treatment:       Reason Eval/Treat Not Completed: SLP screened, no acute needs identified in regards to speech, language, cognition or swallowing. Pt and family report baseline function. Will sign off.    Avie Echevaria, MA, CCC-SLP Acute Rehabilitation Services Office Number: [phone]  Paulette Blanch 12/04/2021, 10:53 AM

## 2021-12-04 NOTE — Discharge Instructions (Signed)
ASA 81 and plavix for 3 weeks and then ASA 81 alone.  Continue zocor.  Follow up with neurology locally in [place].

## 2021-12-04 NOTE — TOC Transition Note (Signed)
Transition of Care [facility]) - CM/SW Discharge Note   Patient Details  Name: Donald Garcia MRN: [id number] Date of Birth: 01-25-1931  Transition of Care [facility]) CM/SW Contact:  Kermit Balo, RN Phone Number: 12/04/2021, 4:07 PM   Clinical Narrative:    Patient is discharging home today. No needs per PT/OT.  Pt has transport home.    Final next level of care: Home/Self Care Barriers to Discharge: No Barriers Identified   Patient Goals and CMS Choice        Discharge Placement                       Discharge Plan and Services                                     Social Determinants of Health (SDOH) Interventions     Readmission Risk Interventions No flowsheet data found.

## 2021-12-04 NOTE — Consult Note (Signed)
Stroke Neurology Consultation Note  Consult Requested by: Dr. Pietro Cassis  Reason for Consult: left transient vision loss  Consult Date: 12/04/21   The history was obtained from the pt and family.  During history and examination, all items were able to obtain unless otherwise noted.  History of Present Illness:  Donald Garcia is a 87 y.o. Caucasian male with PMH of HTN, HLD, MGUS, migraine with visual aura admitted for episode of left visual disturbance. Per pt and family, he has history of visual aura without HA, could be either eyes with dark cloud covering partial visual field and some flashing, as frequent as once a week, resolved by itself in 10-15 minutes, no accompanying headache or weakness numbness.  Not taking any migraine medications or seeing neurologist. Yesterday morning, he was walking inside the house and started to have " white smokes with motion in the left entire visual field", after 4-5 minutes his vision on the left side recovered. This episode was different from his normal visual aura because it was in the whole visual field, it was white not dark, and without flashing. Given the difference, he came to ER for evaluation. He denied any numbness or weakness of any of the limbs.   CT showed no acute finding. MRI no infarct. ESR = 12. MRA head and neck no vascular stenosis. 2D echo unremarkable. Neurology was consulted for further evaluation.   Time onset: yesterday morning tPA Given: No: symptoms resolved.  Past Medical History:  Diagnosis Date   GERD (gastroesophageal reflux disease)    Hyperlipemia    Hypertension     History reviewed. No pertinent surgical history.  No family history on file.  Social History:  reports that he has never smoked. He has never used smokeless tobacco. He reports that he does not drink alcohol and does not use drugs.  Allergies:  Allergies  Allergen Reactions   Lisinopril     No current facility-administered medications on file prior to  encounter.   Current Outpatient Medications on File Prior to Encounter  Medication Sig Dispense Refill   aspirin 81 MG chewable tablet Chew 81 mg by mouth daily. Chew 4 tablets morning of 10-03-21     losartan (COZAAR) 25 MG tablet Take 25 mg by mouth daily. evening     metoprolol tartrate (LOPRESSOR) 25 MG tablet Take 25 mg by mouth daily.     omeprazole (PRILOSEC) 20 MG capsule Take 20 mg by mouth daily. otc     simvastatin (ZOCOR) 20 MG tablet Take 20 mg by mouth daily.      Review of Systems: A full ROS was attempted today and was able to be performed.  Systems assessed include - Constitutional, Eyes, HENT, Respiratory, Cardiovascular, Gastrointestinal, Genitourinary, Integument/breast, Hematologic/lymphatic, Musculoskeletal, Neurological, Behavioral/Psych, Endocrine, Allergic/Immunologic - with pertinent responses as per HPI.  Physical Examination: Temp:  [97.8 F (36.6 C)-98.3 F (36.8 C)] 98.3 F (36.8 C) (12/29 1244) Pulse Rate:  [68-82] 73 (12/29 1244) Resp:  [14-20] 20 (12/29 1244) BP: (121-179)/(64-99) 127/73 (12/29 1244) SpO2:  [98 %-100 %] 98 % (12/29 1244)  General - well nourished, well developed, in no apparent distress.    Ophthalmologic - fundi not visualized due to noncooperation.    Cardiovascular - regular rhythm and rate  Mental Status -  Level of arousal and orientation to time, place, and person were intact. Language including expression, naming, repetition, comprehension, reading, and writing was assessed and found intact. Fund of Knowledge was assessed and was intact.  Cranial Nerves  II - Sunnyslope intact OU. III, IV, VI - Extraocular movements intact. V - Facial sensation intact bilaterally. VII - Facial movement intact bilaterally. VIII - Hearing & vestibular intact bilaterally. X - Palate elevates symmetrically. XI - Chin turning & shoulder shrug intact bilaterally. XII - Tongue protrusion intact.  Motor Strength - The patients  strength was normal in all extremities and pronator drift was absent.   Motor Tone & Bulk - Muscle tone was assessed at the neck and appendages and was normal.  Bulk was normal and fasciculations were absent.   Reflexes - The patients reflexes were normal in all extremities and he had no pathological reflexes.  Sensory - Light touch, temperature/pinprick were assessed and were normal.    Coordination - The patient had normal movements in the hands and feet with no ataxia or dysmetria.  Tremor was absent.  Gait and Station - deferred  Data Reviewed: CT HEAD WO CONTRAST (5MM)  Result Date: 12/03/2021 CLINICAL DATA:  loss of vision in Left eye (temp) EXAM: CT HEAD WITHOUT CONTRAST TECHNIQUE: Contiguous axial images were obtained from the base of the skull through the vertex without intravenous contrast. COMPARISON:  None. FINDINGS: Brain: Patchy hypodensities in the basal ganglia bilaterally. No evidence of acute hemorrhage, mass lesion, hydrocephalus, extra-axial fluid collection or midline shift. Mild for age atrophy. Vascular: No hyperdense vessel identified. Calcific intracranial atherosclerosis. Skull: No acute fracture. Sinuses/Orbits: Visualized sinuses are clear. Other: No mastoid effusions. IMPRESSION: Patchy hypodensities within the basal ganglia bilaterally. This could potentially represent chronic microvascular ischemic disease; however, in the absence of a prior acute infarct is difficult to exclude. Consider MRI for more sensitive evaluation. Electronically Signed   By: Margaretha Sheffield M.D.   On: 12/03/2021 12:17   MR ANGIO HEAD WO CONTRAST  Result Date: 12/03/2021 CLINICAL DATA:  Stroke follow-up EXAM: MRI HEAD WITHOUT CONTRAST MRA HEAD WITHOUT CONTRAST MRA NECK WITHOUT CONTRAST TECHNIQUE: Multiplanar, multiecho pulse sequences of the brain and surrounding structures were obtained without intravenous contrast. Angiographic images of the Circle of Willis were obtained using MRA  technique without intravenous contrast. Angiographic images of the neck were obtained using MRA technique without intravenous contrast. Carotid stenosis measurements (when applicable) are obtained utilizing NASCET criteria, using the distal internal carotid diameter as the denominator. COMPARISON:  None. FINDINGS: MRI HEAD FINDINGS Brain: No acute infarct, mass effect or extra-axial collection. No acute or chronic hemorrhage. There is multifocal hyperintense T2-weighted signal within the white matter. Generalized volume loss without a clear lobar predilection. If the midline structures are normal. Vascular: Major flow voids are preserved. Skull and upper cervical spine: Normal calvarium and skull base. Visualized upper cervical spine and soft tissues are normal. Sinuses/Orbits:No paranasal sinus fluid levels or advanced mucosal thickening. No mastoid or middle ear effusion. Normal orbits. MRA HEAD FINDINGS POSTERIOR CIRCULATION: --Vertebral arteries: Normal --Inferior cerebellar arteries: Normal. --Basilar artery: Normal. --Superior cerebellar arteries: Normal. --Posterior cerebral arteries: Normal. ANTERIOR CIRCULATION: --Intracranial internal carotid arteries: There is a medially projecting aneurysm measuring 2 x 2 mm arising from the distal cavernous segment of the right internal artery. Additionally, there is a superiorly and laterally projecting aneurysm in the same location measuring 3 x 2 mm. The left internal carotid artery is normal. --Anterior cerebral arteries (ACA): Normal. --Middle cerebral arteries (MCA): Normal. ANATOMIC VARIANTS: Fetal origin of the left PCA. MRA NECK FINDINGS Three vessel branch pattern of the aorta. Right dominant vertebral arteries. The left vertebral artery V1 segment shows no flow  related enhancement. The right vertebral artery is normal. The carotid systems are normal. IMPRESSION: 1. No acute intracranial abnormality. 2. Mild chronic small vessel disease and volume loss. 3. No  emergent large vessel occlusion or high-grade stenosis. 4. 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 Electronically Signed   By: Ulyses Jarred M.D.   On: 12/03/2021 22:07   MR ANGIO NECK WO CONTRAST  Result Date: 12/03/2021 CLINICAL DATA:  Stroke follow-up EXAM: MRI HEAD WITHOUT CONTRAST MRA HEAD WITHOUT CONTRAST MRA NECK WITHOUT CONTRAST TECHNIQUE: Multiplanar, multiecho pulse sequences of the brain and surrounding structures were obtained without intravenous contrast. Angiographic images of the Circle of Willis were obtained using MRA technique without intravenous contrast. Angiographic images of the neck were obtained using MRA technique without intravenous contrast. Carotid stenosis measurements (when applicable) are obtained utilizing NASCET criteria, using the distal internal carotid diameter as the denominator. COMPARISON:  None. FINDINGS: MRI HEAD FINDINGS Brain: No acute infarct, mass effect or extra-axial collection. No acute or chronic hemorrhage. There is multifocal hyperintense T2-weighted signal within the white matter. Generalized volume loss without a clear lobar predilection. If the midline structures are normal. Vascular: Major flow voids are preserved. Skull and upper cervical spine: Normal calvarium and skull base. Visualized upper cervical spine and soft tissues are normal. Sinuses/Orbits:No paranasal sinus fluid levels or advanced mucosal thickening. No mastoid or middle ear effusion. Normal orbits. MRA HEAD FINDINGS POSTERIOR CIRCULATION: --Vertebral arteries: Normal --Inferior cerebellar arteries: Normal. --Basilar artery: Normal. --Superior cerebellar arteries: Normal. --Posterior cerebral arteries: Normal. ANTERIOR CIRCULATION: --Intracranial internal carotid arteries: There is a medially projecting aneurysm measuring 2 x 2 mm arising from the distal cavernous segment of the right internal artery.  Additionally, there is a superiorly and laterally projecting aneurysm in the same location measuring 3 x 2 mm. The left internal carotid artery is normal. --Anterior cerebral arteries (ACA): Normal. --Middle cerebral arteries (MCA): Normal. ANATOMIC VARIANTS: Fetal origin of the left PCA. MRA NECK FINDINGS Three vessel branch pattern of the aorta. Right dominant vertebral arteries. The left vertebral artery V1 segment shows no flow related enhancement. The right vertebral artery is normal. The carotid systems are normal. IMPRESSION: 1. No acute intracranial abnormality. 2. Mild chronic small vessel disease and volume loss. 3. No emergent large vessel occlusion or high-grade stenosis. 4. 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 Electronically Signed   By: Ulyses Jarred M.D.   On: 12/03/2021 22:07   MR BRAIN WO CONTRAST  Result Date: 12/03/2021 CLINICAL DATA:  Stroke follow-up EXAM: MRI HEAD WITHOUT CONTRAST MRA HEAD WITHOUT CONTRAST MRA NECK WITHOUT CONTRAST TECHNIQUE: Multiplanar, multiecho pulse sequences of the brain and surrounding structures were obtained without intravenous contrast. Angiographic images of the Circle of Willis were obtained using MRA technique without intravenous contrast. Angiographic images of the neck were obtained using MRA technique without intravenous contrast. Carotid stenosis measurements (when applicable) are obtained utilizing NASCET criteria, using the distal internal carotid diameter as the denominator. COMPARISON:  None. FINDINGS: MRI HEAD FINDINGS Brain: No acute infarct, mass effect or extra-axial collection. No acute or chronic hemorrhage. There is multifocal hyperintense T2-weighted signal within the white matter. Generalized volume loss without a clear lobar predilection. If the midline structures are normal. Vascular: Major flow voids are preserved. Skull and upper cervical spine:  Normal calvarium and skull base. Visualized  upper cervical spine and soft tissues are normal. Sinuses/Orbits:No paranasal sinus fluid levels or advanced mucosal thickening. No mastoid or middle ear effusion. Normal orbits. MRA HEAD FINDINGS POSTERIOR CIRCULATION: --Vertebral arteries: Normal --Inferior cerebellar arteries: Normal. --Basilar artery: Normal. --Superior cerebellar arteries: Normal. --Posterior cerebral arteries: Normal. ANTERIOR CIRCULATION: --Intracranial internal carotid arteries: There is a medially projecting aneurysm measuring 2 x 2 mm arising from the distal cavernous segment of the right internal artery. Additionally, there is a superiorly and laterally projecting aneurysm in the same location measuring 3 x 2 mm. The left internal carotid artery is normal. --Anterior cerebral arteries (ACA): Normal. --Middle cerebral arteries (MCA): Normal. ANATOMIC VARIANTS: Fetal origin of the left PCA. MRA NECK FINDINGS Three vessel branch pattern of the aorta. Right dominant vertebral arteries. The left vertebral artery V1 segment shows no flow related enhancement. The right vertebral artery is normal. The carotid systems are normal. IMPRESSION: 1. No acute intracranial abnormality. 2. Mild chronic small vessel disease and volume loss. 3. No emergent large vessel occlusion or high-grade stenosis. 4. 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 Electronically Signed   By: Ulyses Jarred M.D.   On: 12/03/2021 22:07   ECHOCARDIOGRAM COMPLETE  Result Date: 12/04/2021    ECHOCARDIOGRAM REPORT   Patient Name:   Donald Garcia Date of Exam: 12/04/2021 Medical Rec #:  [id number]  Height:       67.0 in Accession #:    [id number] Weight:       195.0 lb Date of Birth:  1931/11/11   BSA:          2.001 m Patient Age:    62 years   BP:           166/88 mmHg Patient Gender: M          HR:           83 bpm. Exam Location:  Inpatient  Procedure: 2D Echo, 3D Echo, Cardiac Doppler and Color Doppler Indications:    TIA  History:        Patient has no prior history of Echocardiogram examinations.                 Risk Factors:Hypertension and Dyslipidemia. Migraines.  Sonographer:    Roseanna Rainbow RDCS Referring Phys: [id number] [place] Comments: Image acquisition challenging due to patient body habitus. IMPRESSIONS  1. Left ventricular ejection fraction, by estimation, is 65 to 70%. The left ventricle has normal function. The left ventricle has no regional wall motion abnormalities. There is mild concentric left ventricular hypertrophy. Left ventricular diastolic parameters are consistent with Grade I diastolic dysfunction (impaired relaxation).  2. Right ventricular systolic function is normal. The right ventricular size is normal. There is normal pulmonary artery systolic pressure.  3. The mitral valve is grossly normal. No evidence of mitral valve regurgitation. No evidence of mitral stenosis.  4. The aortic valve is tricuspid. Aortic valve regurgitation is mild. No aortic stenosis is present. Aortic regurgitation PHT measures 842 msec.  5. The inferior vena cava is dilated in size with >50% respiratory variability, suggesting right atrial pressure of 8 mmHg. FINDINGS  Left Ventricle: Left ventricular ejection fraction, by estimation, is 65 to 70%. The left ventricle has normal function. The left ventricle has no regional wall motion abnormalities. The left ventricular internal cavity size was normal in size. There is  mild concentric left ventricular hypertrophy. Left  ventricular diastolic parameters are consistent with Grade I diastolic dysfunction (impaired relaxation). Indeterminate filling pressures. Right Ventricle: The right ventricular size is normal. No increase in right ventricular wall thickness. Right ventricular systolic function is normal. There is normal pulmonary artery systolic pressure. The tricuspid regurgitant  velocity is 1.98 m/s, and  with an assumed right atrial pressure of 8 mmHg, the estimated right ventricular systolic pressure is 21.3 mmHg. Left Atrium: Left atrial size was normal in size. Right Atrium: Right atrial size was normal in size. Pericardium: There is no evidence of pericardial effusion. Mitral Valve: The mitral valve is grossly normal. There is mild thickening of the mitral valve leaflet(s). No evidence of mitral valve regurgitation. No evidence of mitral valve stenosis. Tricuspid Valve: The tricuspid valve is normal in structure. Tricuspid valve regurgitation is trivial. No evidence of tricuspid stenosis. Aortic Valve: The aortic valve is tricuspid. Aortic valve regurgitation is mild. Aortic regurgitation PHT measures 842 msec. No aortic stenosis is present. Pulmonic Valve: The pulmonic valve was normal in structure. Pulmonic valve regurgitation is not visualized. No evidence of pulmonic stenosis. Aorta: The aortic root is normal in size and structure. Venous: The inferior vena cava is dilated in size with greater than 50% respiratory variability, suggesting right atrial pressure of 8 mmHg. IAS/Shunts: No atrial level shunt detected by color flow Doppler.  LEFT VENTRICLE PLAX 2D LVIDd:         3.80 cm     Diastology LVIDs:         2.70 cm     LV e' medial:    4.68 cm/s LV PW:         1.20 cm     LV E/e' medial:  12.7 LV IVS:        1.10 cm     LV e' lateral:   7.29 cm/s LVOT diam:     2.00 cm     LV E/e' lateral: 8.2 LV SV:         56 LV SV Index:   28 LVOT Area:     3.14 cm                             3D Volume EF: LV Volumes (MOD)           3D EF:        66 % LV vol d, MOD A2C: 55.8 ml LV EDV:       122 ml LV vol d, MOD A4C: 55.4 ml LV ESV:       41 ml LV vol s, MOD A2C: 13.0 ml LV SV:        81 ml LV vol s, MOD A4C: 17.4 ml LV SV MOD A2C:     42.8 ml LV SV MOD A4C:     55.4 ml LV SV MOD BP:      43.5 ml RIGHT VENTRICLE             IVC RV S prime:     15.00 cm/s  IVC diam: 2.10 cm TAPSE (M-mode):  1.7 cm LEFT ATRIUM             Index        RIGHT ATRIUM          Index LA diam:        4.00 cm 2.00 cm/m   RA Area:     8.50 cm LA Vol (A2C):   28.1  ml 14.05 ml/m  RA Volume:   14.80 ml 7.40 ml/m LA Vol (A4C):   19.4 ml 9.70 ml/m LA Biplane Vol: 23.9 ml 11.95 ml/m  AORTIC VALVE LVOT Vmax:   101.90 cm/s LVOT Vmean:  64.350 cm/s LVOT VTI:    0.178 m AI PHT:      842 msec  AORTA Ao Root diam: 3.40 cm Ao Asc diam:  3.20 cm MITRAL VALVE               TRICUSPID VALVE MV Area (PHT): 3.25 cm    TR Peak grad:   15.7 mmHg MV Decel Time: 234 msec    TR Vmax:        198.00 cm/s MV E velocity: 59.50 cm/s MV A velocity: 79.93 cm/s  SHUNTS MV E/A ratio:  0.74        Systemic VTI:  0.18 m                            Systemic Diam: 2.00 cm Skeet Latch MD Electronically signed by Skeet Latch MD Signature Date/Time: 12/04/2021/11:30:05 AM    Final     Assessment: 87 y.o. male with PMH of HTN, HLD, MGUS, migraine with visual aura admitted for episode of left visual disturbance. Symptoms resolved in 4-94mn. Pt does have frequent migraine visual aura but no HA or other symptoms. He stated yesterday visual disturbance was different from his usual visual aura. CT, MRI and MRA head and neck negative for stroke or left sided carotid stenosis. EF normal range. ESR = 12. LDL 78, A1C 5.9. Neuro exam intact without deficit. He is not taking ASA 81 at home routinely.   From his description, his visual disturbance yesterday still more likely migraine visual aura, given no vision loss, white smokes with motion, and hx of frequent visual aura, as well as no vascular findings. However, he does have stroke risk factors. For to be safe, he agreed with ASA 81 and plavix for 3 weeks and then ASA 81 alone. Continue zocor. [place]   Stroke Risk Factors - hyperlipidemia and hypertension  Plan: ASA 81 and plavix for 3 weeks and then ASA 81 alone.  Continue zocor.  [place] to be discharged from neuro standpoint.   Thank you for this consultation and allowing [facility] participate in the care of this patient.  JRosalin Hawking MD PhD Stroke Neurology 12/04/2021 4:10 PM

## 2021-12-04 NOTE — Progress Notes (Signed)
°  Echocardiogram 2D Echocardiogram has been performed.  Janalyn Harder 12/04/2021, 8:41 AM

## 2021-12-04 NOTE — Progress Notes (Signed)
Discharge instructions discussed with patient and family at bedside. All questions answered. Iv removed, tele discontinued. Pt walked off unit for transport home by daughter.  Melony Overly, RN

## 2021-12-05 NOTE — Discharge Summary (Signed)
Physician Discharge Summary  Donald Garcia AXE:[id number] DOB: [birth year] DOA: 12/03/2021  PCP: System, Provider Not In  Admit date: 12/03/2021 Discharge date: 12/04/2021  Admitted From: Home Discharge disposition: Home   Code Status: Prior   Discharge Diagnosis:   Principal Problem:   TIA (transient ischemic attack)    Chief Complaint  Patient presents with   Visiual Loss    Brief narrative: Donald Garcia is a 87 y.o. male with PMH significant for HTN, HLD, MGUS, migraines, GERD. Patient presented to the ED on 12/28 for temporary vision loss. On 12/28, he was walking inside the house and started to have 'clouds in the left visual field' soon after which he had complete lost vision on the left side. After 4-5 minutes his vision on the left side recovered. At baseline, his right eye has poor vision compared to the left.   In the ED, vital signs stable, blood pressure 147/93 Labs with creatinine elevated 1.3, hemoglobin 12.3. CT head showed patchy hypodensities within the basal ganglia bilaterally suggestive of chronic microvascular ischemic disease.  MRI brain did not show any acute intracranial abnormality. MRA head and neck did not show any large vessel occlusion or high-grade stenosis.  Subjective: Patient was seen and examined this morning.  [name].  Not in distress.  Sitting at the edge of the bed.  Family at bedside.  Hospital course: TIA Amaurosis fugax of left eye -Symptoms resolved in few minutes of onset -Stroke work-up in process. -Imagings as above. -A1c 5.9, HDL 36, LDL 78 -Echo with EF 65 to 70%, mild concentric LVH, grade 1 diastolic dysfunction. -Prior to admission, patient was on simvastatin 20 mg daily.  He states that he was supposed to be on aspirin as well but he has not taken that for a long time and its not because of any side effect.  -Seen by neurology.  Recommended aspirin 81 mg daily and Plavix and famotidine daily for next 3  weeks followed by aspirin alone. -Seen by PT/OT.  No follow-up recommendation  Essential hypertension -Prior to admission patient was on metoprolol 25 mg daily and losartan 25 mg daily.   -Continue the same.  Migraine -Patient has history of migraines, which he describes as flashing point in visual field, as frequent as once a week, resolves by itself in few minutes, no accompanying headache or weakness nor numbness of any of the limbs.  This month, he had had two such episode.    MGUS -MGUS was diagnosed in 2009, had a bone marrow biopsy in 2019.  Has been following with hematologist in [place], according to patient and his family, his condition has been stable, never had chemotherapy.  Most recent visit was 1 month ago and was told condition was stable.  Discharge Medications:   Allergies as of 12/04/2021       Reactions   Lisinopril         Medication List     TAKE these medications    aspirin 81 MG chewable tablet Chew 1 tablet (81 mg total) by mouth daily. What changed: additional instructions   clopidogrel 75 MG tablet Commonly known as: PLAVIX Take 1 tablet (75 mg total) by mouth daily for 21 days.   losartan 25 MG tablet Commonly known as: COZAAR Take 25 mg by mouth daily. evening   metoprolol tartrate 25 MG tablet Commonly known as: LOPRESSOR Take 25 mg by mouth daily.   omeprazole 20 MG capsule Commonly known as: PRILOSEC Take 20 mg  by mouth daily. otc   simvastatin 20 MG tablet Commonly known as: ZOCOR Take 20 mg by mouth daily.        Wound care:     Discharge Instructions:   Discharge Instructions     Call MD for:  difficulty breathing, headache or visual disturbances   Complete by: As directed    Call MD for:  extreme fatigue   Complete by: As directed    Call MD for:  hives   Complete by: As directed    Call MD for:  persistant dizziness or light-headedness   Complete by: As directed    Call MD for:  persistant nausea and vomiting    Complete by: As directed    Call MD for:  severe uncontrolled pain   Complete by: As directed    Call MD for:  temperature >100.4   Complete by: As directed    Diet general   Complete by: As directed    Discharge instructions   Complete by: As directed    General discharge instructions:  Follow with Primary MD System, Provider Not In in 7 days   Get CBC/BMP checked in next visit within 1 week by PCP or SNF MD. (We routinely change or add medications that can affect your baseline labs and fluid status, therefore we recommend that you get the mentioned basic workup next visit with your PCP, your PCP may decide not to get them or add new tests based on their clinical decision)  On your next visit with your PCP, please get your medicines reviewed and adjusted.  Please request your PCP  to go over all hospital tests, procedures, radiology results at the follow up, please get all Hospital records sent to your PCP by signing hospital release before you go home.  Activity: As tolerated with Full fall precautions use walker/cane & assistance as needed  Avoid using any recreational substances like cigarette, tobacco, alcohol, or non-prescribed drug.  If you experience worsening of your admission symptoms, develop shortness of breath, life threatening emergency, suicidal or homicidal thoughts you must seek medical attention immediately by calling 911 or calling your MD immediately  if symptoms less severe.  You must read complete instructions/literature along with all the possible adverse reactions/side effects for all the medicines you take and that have been prescribed to you. Take any new medicine only after you have completely understood and accepted all the possible adverse reactions/side effects.   Do not drive, operate heavy machinery, perform activities at heights, swimming or participation in water activities or provide baby sitting services if your were admitted for syncope or siezures  until you have seen by Primary MD or a Neurologist and advised to do so again.  Do not drive when taking Pain medications.  Do not take more than prescribed Pain, Sleep and Anxiety Medications  Wear Seat belts while driving.  Please note You were cared for by a hospitalist during your hospital stay. If you have any questions about your discharge medications or the care you received while you were in the hospital after you are discharged, you can call the unit and asked to speak with the hospitalist on call if the hospitalist that took care of you is not available. Once you are discharged, your primary care physician will handle any further medical issues. Please note that NO REFILLS for any discharge medications will be authorized once you are discharged, as it is imperative that you return to your primary care physician (or establish  a relationship with a primary care physician if you do not have one) for your aftercare needs so that they can reassess your need for medications and monitor your lab values.   Increase activity slowly   Complete by: As directed        Follow ups:    Follow-up [facility].   Contact information: [place] [address] [place] .   Contact information: [address]     [address] [place] [address] [phone]                Discharge Exam:   Vitals:   12/04/21 0120 12/04/21 0356 12/04/21 0743 12/04/21 1244  BP: 128/79 (!) 149/99 (!) 166/88 127/73  Pulse:  82  73  Resp: _0 Temp: 98 F (36.7 C) 97.9 F (36.6 C) 97.8 F (36.6 C) 98.3 F (36.8 C)  TempSrc: Oral Oral Oral Oral  SpO2: 99% 99%  98%  Weight:      Height:        Body mass index is 30.54 kg/m.  General exam: Pleasant, elderly Caucasian male.  Not in physical distress Skin: No rashes, lesions or  ulcers. HEENT: Atraumatic, normocephalic, no obvious bleeding Lungs: Clear to auscultation bilaterally CVS: Regular rate and rhythm, no murmur GI/Abd soft, nontender, nondistended, bowel sound present CNS: Alert, awake, oriented x3.  Chronically impaired vision on the right eye.  Left eye vision intact today. Psychiatry: Mood appropriate Extremities: No pedal edema, no calf tenderness  Time coordinating discharge: 35 minutes   The results of significant diagnostics from this hospitalization (including imaging, microbiology, ancillary and laboratory) are listed below for reference.    Procedures and Diagnostic Studies:   CT HEAD WO CONTRAST (5MM)  Result Date: 12/03/2021 CLINICAL DATA:  loss of vision in Left eye (temp) EXAM: CT HEAD WITHOUT CONTRAST TECHNIQUE: Contiguous axial images were obtained from the base of the skull through the vertex without intravenous contrast. COMPARISON:  None. FINDINGS: Brain: Patchy hypodensities in the basal ganglia bilaterally. No evidence of acute hemorrhage, mass lesion, hydrocephalus, extra-axial fluid collection or midline shift. Mild for age atrophy. Vascular: No hyperdense vessel identified. Calcific intracranial atherosclerosis. Skull: No acute fracture. Sinuses/Orbits: Visualized sinuses are clear. Other: No mastoid effusions. IMPRESSION: Patchy hypodensities within the basal ganglia bilaterally. This could potentially represent chronic microvascular ischemic disease; however, in the absence of a prior acute infarct is difficult to exclude. Consider MRI for more sensitive evaluation. Electronically Signed   By: Margaretha Sheffield M.D.   On: 12/03/2021 12:17   MR ANGIO HEAD WO CONTRAST  Result Date: 12/03/2021 CLINICAL DATA:  Stroke follow-up EXAM: MRI HEAD WITHOUT CONTRAST MRA HEAD WITHOUT CONTRAST MRA NECK WITHOUT CONTRAST TECHNIQUE: Multiplanar, multiecho pulse sequences of the brain and surrounding structures were obtained without intravenous  contrast. Angiographic images of the Circle of Willis were obtained using MRA technique without intravenous contrast. Angiographic images of the neck were obtained using MRA technique without intravenous contrast. Carotid stenosis measurements (when applicable) are obtained utilizing NASCET criteria, using the distal internal carotid diameter as the denominator. COMPARISON:  None. FINDINGS: MRI HEAD FINDINGS Brain: No acute infarct, mass effect or extra-axial collection. No acute or chronic hemorrhage. There is multifocal hyperintense T2-weighted signal within the white matter. Generalized volume loss without a clear lobar predilection. If the midline structures are normal.  Vascular: Major flow voids are preserved. Skull and upper cervical spine: Normal calvarium and skull base. Visualized upper cervical spine and soft tissues are normal. Sinuses/Orbits:No paranasal sinus fluid levels or advanced mucosal thickening. No mastoid or middle ear effusion. Normal orbits. MRA HEAD FINDINGS POSTERIOR CIRCULATION: --Vertebral arteries: Normal --Inferior cerebellar arteries: Normal. --Basilar artery: Normal. --Superior cerebellar arteries: Normal. --Posterior cerebral arteries: Normal. ANTERIOR CIRCULATION: --Intracranial internal carotid arteries: There is a medially projecting aneurysm measuring 2 x 2 mm arising from the distal cavernous segment of the right internal artery. Additionally, there is a superiorly and laterally projecting aneurysm in the same location measuring 3 x 2 mm. The left internal carotid artery is normal. --Anterior cerebral arteries (ACA): Normal. --Middle cerebral arteries (MCA): Normal. ANATOMIC VARIANTS: Fetal origin of the left PCA. MRA NECK FINDINGS Three vessel branch pattern of the aorta. Right dominant vertebral arteries. The left vertebral artery V1 segment shows no flow related enhancement. The right vertebral artery is normal. The carotid systems are normal. IMPRESSION: 1. No acute  intracranial abnormality. 2. Mild chronic small vessel disease and volume loss. 3. No emergent large vessel occlusion or high-grade stenosis. 4. 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 Electronically Signed   By: Ulyses Jarred M.D.   On: 12/03/2021 22:07   MR ANGIO NECK WO CONTRAST  Result Date: 12/03/2021 CLINICAL DATA:  Stroke follow-up EXAM: MRI HEAD WITHOUT CONTRAST MRA HEAD WITHOUT CONTRAST MRA NECK WITHOUT CONTRAST TECHNIQUE: Multiplanar, multiecho pulse sequences of the brain and surrounding structures were obtained without intravenous contrast. Angiographic images of the Circle of Willis were obtained using MRA technique without intravenous contrast. Angiographic images of the neck were obtained using MRA technique without intravenous contrast. Carotid stenosis measurements (when applicable) are obtained utilizing NASCET criteria, using the distal internal carotid diameter as the denominator. COMPARISON:  None. FINDINGS: MRI HEAD FINDINGS Brain: No acute infarct, mass effect or extra-axial collection. No acute or chronic hemorrhage. There is multifocal hyperintense T2-weighted signal within the white matter. Generalized volume loss without a clear lobar predilection. If the midline structures are normal. Vascular: Major flow voids are preserved. Skull and upper cervical spine: Normal calvarium and skull base. Visualized upper cervical spine and soft tissues are normal. Sinuses/Orbits:No paranasal sinus fluid levels or advanced mucosal thickening. No mastoid or middle ear effusion. Normal orbits. MRA HEAD FINDINGS POSTERIOR CIRCULATION: --Vertebral arteries: Normal --Inferior cerebellar arteries: Normal. --Basilar artery: Normal. --Superior cerebellar arteries: Normal. --Posterior cerebral arteries: Normal. ANTERIOR CIRCULATION: --Intracranial internal carotid arteries: There is a medially projecting aneurysm  measuring 2 x 2 mm arising from the distal cavernous segment of the right internal artery. Additionally, there is a superiorly and laterally projecting aneurysm in the same location measuring 3 x 2 mm. The left internal carotid artery is normal. --Anterior cerebral arteries (ACA): Normal. --Middle cerebral arteries (MCA): Normal. ANATOMIC VARIANTS: Fetal origin of the left PCA. MRA NECK FINDINGS Three vessel branch pattern of the aorta. Right dominant vertebral arteries. The left vertebral artery V1 segment shows no flow related enhancement. The right vertebral artery is normal. The carotid systems are normal. IMPRESSION: 1. No acute intracranial abnormality. 2. Mild chronic small vessel disease and volume loss. 3. No emergent large vessel occlusion or high-grade stenosis. 4. 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 Electronically Signed   By: Cletus Gash.D.  On: 12/03/2021 22:07   MR BRAIN WO CONTRAST  Result Date: 12/03/2021 CLINICAL DATA:  Stroke follow-up EXAM: MRI HEAD WITHOUT CONTRAST MRA HEAD WITHOUT CONTRAST MRA NECK WITHOUT CONTRAST TECHNIQUE: Multiplanar, multiecho pulse sequences of the brain and surrounding structures were obtained without intravenous contrast. Angiographic images of the Circle of Willis were obtained using MRA technique without intravenous contrast. Angiographic images of the neck were obtained using MRA technique without intravenous contrast. Carotid stenosis measurements (when applicable) are obtained utilizing NASCET criteria, using the distal internal carotid diameter as the denominator. COMPARISON:  None. FINDINGS: MRI HEAD FINDINGS Brain: No acute infarct, mass effect or extra-axial collection. No acute or chronic hemorrhage. There is multifocal hyperintense T2-weighted signal within the white matter. Generalized volume loss without a clear lobar predilection. If the midline  structures are normal. Vascular: Major flow voids are preserved. Skull and upper cervical spine: Normal calvarium and skull base. Visualized upper cervical spine and soft tissues are normal. Sinuses/Orbits:No paranasal sinus fluid levels or advanced mucosal thickening. No mastoid or middle ear effusion. Normal orbits. MRA HEAD FINDINGS POSTERIOR CIRCULATION: --Vertebral arteries: Normal --Inferior cerebellar arteries: Normal. --Basilar artery: Normal. --Superior cerebellar arteries: Normal. --Posterior cerebral arteries: Normal. ANTERIOR CIRCULATION: --Intracranial internal carotid arteries: There is a medially projecting aneurysm measuring 2 x 2 mm arising from the distal cavernous segment of the right internal artery. Additionally, there is a superiorly and laterally projecting aneurysm in the same location measuring 3 x 2 mm. The left internal carotid artery is normal. --Anterior cerebral arteries (ACA): Normal. --Middle cerebral arteries (MCA): Normal. ANATOMIC VARIANTS: Fetal origin of the left PCA. MRA NECK FINDINGS Three vessel branch pattern of the aorta. Right dominant vertebral arteries. The left vertebral artery V1 segment shows no flow related enhancement. The right vertebral artery is normal. The carotid systems are normal. IMPRESSION: 1. No acute intracranial abnormality. 2. Mild chronic small vessel disease and volume loss. 3. No emergent large vessel occlusion or high-grade stenosis. 4. 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 Electronically Signed   By: Ulyses Jarred M.D.   On: 12/03/2021 22:07   ECHOCARDIOGRAM COMPLETE  Result Date: 12/04/2021    ECHOCARDIOGRAM REPORT   Patient Name:   Donald Garcia Date of Exam: 12/04/2021 Medical Rec #:  [id number]  Height:       67.0 in Accession #:    [id number] Weight:       195.0 lb Date of Birth:  [birth year]   BSA:          2.001 m Patient Age:    52 years   BP:            166/88 mmHg Patient Gender: M          HR:           83 bpm. Exam Location:  Inpatient Procedure: 2D Echo, 3D Echo, Cardiac Doppler and Color Doppler Indications:    TIA  History:        Patient has no prior history of Echocardiogram examinations.                 Risk Factors:Hypertension and Dyslipidemia. Migraines.  Sonographer:    Roseanna Rainbow RDCS Referring Phys: [id number] [place] Comments: Image acquisition challenging due to patient body habitus. IMPRESSIONS  1. Left ventricular ejection fraction, by estimation, is 65 to 70%. The left ventricle has normal function. The left ventricle has  no regional wall motion abnormalities. There is mild concentric left ventricular hypertrophy. Left ventricular diastolic parameters are consistent with Grade I diastolic dysfunction (impaired relaxation).  2. Right ventricular systolic function is normal. The right ventricular size is normal. There is normal pulmonary artery systolic pressure.  3. The mitral valve is grossly normal. No evidence of mitral valve regurgitation. No evidence of mitral stenosis.  4. The aortic valve is tricuspid. Aortic valve regurgitation is mild. No aortic stenosis is present. Aortic regurgitation PHT measures 842 msec.  5. The inferior vena cava is dilated in size with >50% respiratory variability, suggesting right atrial pressure of 8 mmHg. FINDINGS  Left Ventricle: Left ventricular ejection fraction, by estimation, is 65 to 70%. The left ventricle has normal function. The left ventricle has no regional wall motion abnormalities. The left ventricular internal cavity size was normal in size. There is  mild concentric left ventricular hypertrophy. Left ventricular diastolic parameters are consistent with Grade I diastolic dysfunction (impaired relaxation). Indeterminate filling pressures. Right Ventricle: The right ventricular size is normal. No increase in right ventricular wall thickness. Right ventricular systolic function is  normal. There is normal pulmonary artery systolic pressure. The tricuspid regurgitant velocity is 1.98 m/s, and  with an assumed right atrial pressure of 8 mmHg, the estimated right ventricular systolic pressure is 56.7 mmHg. Left Atrium: Left atrial size was normal in size. Right Atrium: Right atrial size was normal in size. Pericardium: There is no evidence of pericardial effusion. Mitral Valve: The mitral valve is grossly normal. There is mild thickening of the mitral valve leaflet(s). No evidence of mitral valve regurgitation. No evidence of mitral valve stenosis. Tricuspid Valve: The tricuspid valve is normal in structure. Tricuspid valve regurgitation is trivial. No evidence of tricuspid stenosis. Aortic Valve: The aortic valve is tricuspid. Aortic valve regurgitation is mild. Aortic regurgitation PHT measures 842 msec. No aortic stenosis is present. Pulmonic Valve: The pulmonic valve was normal in structure. Pulmonic valve regurgitation is not visualized. No evidence of pulmonic stenosis. Aorta: The aortic root is normal in size and structure. Venous: The inferior vena cava is dilated in size with greater than 50% respiratory variability, suggesting right atrial pressure of 8 mmHg. IAS/Shunts: No atrial level shunt detected by color flow Doppler.  LEFT VENTRICLE PLAX 2D LVIDd:         3.80 cm     Diastology LVIDs:         2.70 cm     LV e' medial:    4.68 cm/s LV PW:         1.20 cm     LV E/e' medial:  12.7 LV IVS:        1.10 cm     LV e' lateral:   7.29 cm/s LVOT diam:     2.00 cm     LV E/e' lateral: 8.2 LV SV:         56 LV SV Index:   28 LVOT Area:     3.14 cm                             3D Volume EF: LV Volumes (MOD)           3D EF:        66 % LV vol d, MOD A2C: 55.8 ml LV EDV:       122 ml LV vol d, MOD A4C: 55.4 ml LV ESV:  41 ml LV vol s, MOD A2C: 13.0 ml LV SV:        81 ml LV vol s, MOD A4C: 17.4 ml LV SV MOD A2C:     42.8 ml LV SV MOD A4C:     55.4 ml LV SV MOD BP:      43.5 ml RIGHT  VENTRICLE             IVC RV S prime:     15.00 cm/s  IVC diam: 2.10 cm TAPSE (M-mode): 1.7 cm LEFT ATRIUM             Index        RIGHT ATRIUM          Index LA diam:        4.00 cm 2.00 cm/m   RA Area:     8.50 cm LA Vol (A2C):   28.1 ml 14.05 ml/m  RA Volume:   14.80 ml 7.40 ml/m LA Vol (A4C):   19.4 ml 9.70 ml/m LA Biplane Vol: 23.9 ml 11.95 ml/m  AORTIC VALVE LVOT Vmax:   101.90 cm/s LVOT Vmean:  64.350 cm/s LVOT VTI:    0.178 m AI PHT:      842 msec  AORTA Ao Root diam: 3.40 cm Ao Asc diam:  3.20 cm MITRAL VALVE               TRICUSPID VALVE MV Area (PHT): 3.25 cm    TR Peak grad:   15.7 mmHg MV Decel Time: 234 msec    TR Vmax:        198.00 cm/s MV E velocity: 59.50 cm/s MV A velocity: 79.93 cm/s  SHUNTS MV E/A ratio:  0.74        Systemic VTI:  0.18 m                            Systemic Diam: 2.00 cm Skeet Latch MD Electronically signed by Skeet Latch MD Signature Date/Time: 12/04/2021/11:30:05 AM    Final      Labs:   Basic Metabolic Panel: Recent Labs  Lab 12/03/21 1353  NA 136  K 4.1  CL 104  CO2 24  GLUCOSE 94  BUN 22  CREATININE 1.30*  CALCIUM 8.8*   GFR Estimated Creatinine Clearance: 41.7 mL/min (A) (by C-G formula based on SCr of 1.3 mg/dL (H)). Liver Function Tests: No results for input(s): AST, ALT, ALKPHOS, BILITOT, PROT, ALBUMIN in the last 168 hours. No results for input(s): LIPASE, AMYLASE in the last 168 hours. No results for input(s): AMMONIA in the last 168 hours. Coagulation profile Recent Labs  Lab 12/03/21 1353  INR 1.0    CBC: Recent Labs  Lab 12/03/21 1353  WBC 6.3  NEUTROABS 2.7  HGB 12.3*  HCT 37.7*  MCV 90.0  PLT 252   Cardiac Enzymes: No results for input(s): CKTOTAL, CKMB, CKMBINDEX, TROPONINI in the last 168 hours. BNP: Invalid input(s): POCBNP CBG: No results for input(s): GLUCAP in the last 168 hours. D-Dimer No results for input(s): DDIMER in the last 72 hours. Hgb A1c Recent Labs    12/04/21 0217  HGBA1C  5.9*   Lipid Profile Recent Labs    12/04/21 0217  CHOL 142  HDL 36*  LDLCALC 78  TRIG 139  CHOLHDL 3.9   Thyroid function studies No results for input(s): TSH, T4TOTAL, T3FREE, THYROIDAB in the last 72 hours.  Invalid input(s): FREET3 Anemia work up  No results for input(s): VITAMINB12, FOLATE, FERRITIN, TIBC, IRON, RETICCTPCT in the last 72 hours. Microbiology Recent Results (from the past 240 hour(s))  Resp Panel by RT-PCR (Flu A&B, Covid) Nasopharyngeal Swab     Status: None   Collection Time: 12/03/21  3:27 PM   Specimen: Nasopharyngeal Swab; Nasopharyngeal(NP) swabs in vial transport medium  Result Value Ref Range Status   SARS Coronavirus 2 by RT PCR NEGATIVE NEGATIVE Final    Comment: (NOTE) SARS-CoV-2 target nucleic acids are NOT DETECTED.  The SARS-CoV-2 RNA is generally detectable in upper respiratory specimens during the acute phase of infection. The lowest concentration of SARS-CoV-2 viral copies this assay can detect is 138 copies/mL. A negative result does not preclude SARS-Cov-2 infection and should not be used as the sole basis for treatment or other patient management decisions. A negative result may occur with  improper specimen collection/handling, submission of specimen other than nasopharyngeal swab, presence of viral mutation(s) within the areas targeted by this assay, and inadequate number of viral copies(<138 copies/mL). A negative result must be combined with clinical observations, patient history, and epidemiological information. The expected result is Negative.  Fact Sheet for Patients:  EntrepreneurPulse.com.au  Fact Sheet for Healthcare Providers:  IncredibleEmployment.be  This test is no t yet approved or cleared by the [place] FDA and  has been authorized for detection and/or diagnosis of SARS-CoV-2 by FDA under an Emergency Use Authorization (EUA). This EUA will remain  in effect (meaning this  test can be used) for the duration of the COVID-19 declaration under Section 564(b)(1) of the Act, 21 U.S.C.section 360bbb-3(b)(1), unless the authorization is terminated  or revoked sooner.       Influenza A by PCR NEGATIVE NEGATIVE Final   Influenza B by PCR NEGATIVE NEGATIVE Final    Comment: (NOTE) The Xpert Xpress SARS-CoV-2/FLU/RSV plus assay is intended as an aid in the diagnosis of influenza from Nasopharyngeal swab specimens and should not be used as a sole basis for treatment. Nasal washings and aspirates are unacceptable for Xpert Xpress SARS-CoV-2/FLU/RSV testing.  Fact Sheet for Patients: EntrepreneurPulse.com.au  Fact Sheet for Healthcare Providers: IncredibleEmployment.be  This test is not yet approved or cleared by the [place] FDA and has been authorized for detection and/or diagnosis of SARS-CoV-2 by FDA under an Emergency Use Authorization (EUA). This EUA will remain in effect (meaning this test can be used) for the duration of the COVID-19 declaration under Section 564(b)(1) of the Act, 21 U.S.C. section 360bbb-3(b)(1), unless the authorization is terminated or revoked.  Performed at [facility], [address] [place], [place] [address]      Signed: Terrilee Croak  [facility] 12/05/2021, 7:30 AM
